# Patient Record
Sex: Male | Born: 1980 | Race: Black or African American | Hispanic: No | Marital: Single
Health system: Midwestern US, Community
[De-identification: ages and names within clinical notes are randomized; demographics above are authoritative.]

## PROBLEM LIST (undated history)

## (undated) HISTORY — PX: FRACTURE SURGERY: SHX138

## (undated) HISTORY — PX: HERNIA REPAIR: SHX51

---
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
## 1999-04-07 ENCOUNTER — Encounter: Payer: Self-pay | Admitting: Emergency Medicine

## 1999-04-07 ENCOUNTER — Emergency Department (HOSPITAL_COMMUNITY): Admission: EM | Admit: 1999-04-07 | Discharge: 1999-04-07 | Payer: Self-pay | Admitting: Emergency Medicine

## 2000-03-07 ENCOUNTER — Encounter: Payer: Self-pay | Admitting: Emergency Medicine

## 2000-03-07 ENCOUNTER — Emergency Department (HOSPITAL_COMMUNITY): Admission: EM | Admit: 2000-03-07 | Discharge: 2000-03-07 | Payer: Self-pay | Admitting: Emergency Medicine

## 2000-03-20 ENCOUNTER — Emergency Department (HOSPITAL_COMMUNITY): Admission: EM | Admit: 2000-03-20 | Discharge: 2000-03-20 | Payer: Self-pay | Admitting: Emergency Medicine

## 2000-06-11 ENCOUNTER — Emergency Department (HOSPITAL_COMMUNITY): Admission: EM | Admit: 2000-06-11 | Discharge: 2000-06-11 | Payer: Self-pay | Admitting: Emergency Medicine

## 2001-08-28 ENCOUNTER — Emergency Department (HOSPITAL_COMMUNITY): Admission: EM | Admit: 2001-08-28 | Discharge: 2001-08-28 | Payer: Self-pay | Admitting: Emergency Medicine

## 2001-08-28 ENCOUNTER — Encounter: Payer: Self-pay | Admitting: Emergency Medicine

## 2004-02-19 ENCOUNTER — Emergency Department (HOSPITAL_COMMUNITY): Admission: EM | Admit: 2004-02-19 | Discharge: 2004-02-19 | Payer: Self-pay | Admitting: Emergency Medicine

## 2007-04-12 ENCOUNTER — Emergency Department (HOSPITAL_COMMUNITY): Admission: EM | Admit: 2007-04-12 | Discharge: 2007-04-12 | Payer: Self-pay | Admitting: Emergency Medicine

## 2008-06-16 ENCOUNTER — Emergency Department (HOSPITAL_COMMUNITY): Admission: EM | Admit: 2008-06-16 | Discharge: 2008-06-16 | Payer: Self-pay | Admitting: Emergency Medicine

## 2010-02-04 ENCOUNTER — Emergency Department (HOSPITAL_COMMUNITY)
Admission: EM | Admit: 2010-02-04 | Discharge: 2010-02-04 | Payer: Self-pay | Source: Home / Self Care | Admitting: Emergency Medicine

## 2010-06-18 NOTE — Consult Note (Signed)
[facility]  Patient:    Bryant, Isaac                    MRN: [id number] Proc. Date: 07/12/00 Adm. Date:  [id number] Disc. Date: [id number] Attending:  Sandi Raveling CC:         Mayra Reel. Chase Picket, M.D.   Consultation Report  REFERRING PHYSICIAN:  Sanford H. Chase Picket, M.D.  REASON FOR CONSULTATION:  This is a very pleasant 30 year old male, left-hand dominant, who was playing basketball and sustained an MP dislocation of the thumb on his left hand.  He was seen by the ER physicians, and they were unable to relocate his thumb MP joint.  He is an otherwise healthy 30 year old male with no known drug allergies and no current medications, recent hospitalizations or surgeries.  FAMILY MEDICAL HISTORY:  Noncontributory.  SOCIAL HISTORY:  Noncontributory.  PHYSICAL EXAMINATION:  EXTREMITIES:  Dislocated MP joint with dorsal displacement of the proximal phalanx of the metacarpal head.  TREATMENT:  The patient was anesthetized using 2% plain lidocaine.  Once this was done, the wrist was fully flexed and, with slight MP pressure, the MP joint was reduced.  Post reduction films show adequate reduction in both the AP and lateral plane.  He was placed in a thumb spica splint and will follow up in my office in the next 24-48 hours. DD:  07/12/00 TD:  07/13/00 Job: [id number]

## 2010-10-25 LAB — RAPID STREP SCREEN (MED CTR MEBANE ONLY): Streptococcus, Group A Screen (Direct): NEGATIVE

## 2010-11-01 ENCOUNTER — Emergency Department (HOSPITAL_COMMUNITY)
Admission: EM | Admit: 2010-11-01 | Discharge: 2010-11-01 | Disposition: A | Discharge status: Home / Self Care | Payer: Medicaid Other | Attending: Emergency Medicine | Admitting: Emergency Medicine

## 2010-11-01 DIAGNOSIS — K089 Disorder of teeth and supporting structures, unspecified: Secondary | ICD-10-CM | POA: Insufficient documentation

## 2010-11-01 DIAGNOSIS — K047 Periapical abscess without sinus: Secondary | ICD-10-CM | POA: Insufficient documentation

## 2010-11-07 ENCOUNTER — Emergency Department (HOSPITAL_COMMUNITY)
Admission: EM | Admit: 2010-11-07 | Discharge: 2010-11-07 | Disposition: A | Discharge status: Home / Self Care | Payer: Medicaid Other | Attending: Emergency Medicine | Admitting: Emergency Medicine

## 2010-11-07 DIAGNOSIS — K047 Periapical abscess without sinus: Secondary | ICD-10-CM | POA: Insufficient documentation

## 2010-11-07 DIAGNOSIS — K089 Disorder of teeth and supporting structures, unspecified: Secondary | ICD-10-CM | POA: Insufficient documentation

## 2011-06-12 ENCOUNTER — Encounter (HOSPITAL_COMMUNITY): Payer: Self-pay | Admitting: *Deleted

## 2011-06-12 ENCOUNTER — Emergency Department (HOSPITAL_COMMUNITY)
Admission: EM | Admit: 2011-06-12 | Discharge: 2011-06-12 | Disposition: A | Discharge status: Home / Self Care | Payer: Medicaid Other | Attending: Emergency Medicine | Admitting: Emergency Medicine

## 2011-06-12 DIAGNOSIS — X503XXA Overexertion from repetitive movements, initial encounter: Secondary | ICD-10-CM | POA: Insufficient documentation

## 2011-06-12 DIAGNOSIS — Y93H9 Activity, other involving exterior property and land maintenance, building and construction: Secondary | ICD-10-CM | POA: Insufficient documentation

## 2011-06-12 DIAGNOSIS — F17200 Nicotine dependence, unspecified, uncomplicated: Secondary | ICD-10-CM | POA: Insufficient documentation

## 2011-06-12 DIAGNOSIS — S335XXA Sprain of ligaments of lumbar spine, initial encounter: Secondary | ICD-10-CM | POA: Insufficient documentation

## 2011-06-12 MED ORDER — DIAZEPAM 5 MG PO TABS: 5.0000 mg | ORAL_TABLET | Freq: Three times a day (TID) | ORAL | Status: AC | PRN

## 2011-06-12 MED ORDER — IBUPROFEN 800 MG PO TABS: 800.0000 mg | ORAL_TABLET | Freq: Three times a day (TID) | ORAL | Status: DC | PRN

## 2011-06-12 NOTE — Discharge Instructions (Signed)
Read the information below.  Rest your back for a short period before working back up to your normal activity.  Use cold compresses, massage, and gentle stretching while you heal.  If you continue to have pain next week, follow up with your primary care provider.  You may return to the ER at any time for worsening condition or any new symptoms that concern you.   Lumbosacral Strain Lumbosacral strain is one of the most common causes of back pain. There are many causes of back pain. Most are not serious conditions. CAUSES  Your backbone (spinal column) is made up of 24 main vertebral bodies, the sacrum, and the coccyx. These are held together by muscles and tough, fibrous tissue (ligaments). Nerve roots pass through the openings between the vertebrae. A sudden move or injury to the back may cause injury to, or pressure on, these nerves. This may result in localized back pain or pain movement (radiation) into the buttocks, down the leg, and into the foot. Sharp, shooting pain from the buttock down the back of the leg (sciatica) is frequently associated with a ruptured (herniated) disk. Pain may be caused by muscle spasm alone. Your caregiver can often find the cause of your pain by the details of your symptoms and an exam. In some cases, you may need tests (such as X-rays). Your caregiver will work with you to decide if any tests are needed based on your specific exam. HOME CARE INSTRUCTIONS   Avoid an underactive lifestyle. Active exercise, as directed by your caregiver, is your greatest weapon against back pain.   Avoid hard physical activities (tennis, racquetball, waterskiing) if you are not in proper physical condition for it. This may aggravate or create problems.   If you have a back problem, avoid sports requiring sudden body movements. Swimming and walking are generally safer activities.   Maintain good posture.   Avoid becoming overweight (obese).   Use bed rest for only the most extreme,  sudden (acute) episode. Your caregiver will help you determine how much bed rest is necessary.   For acute conditions, you may put ice on the injured area.   Put ice in a plastic bag.   Place a towel between your skin and the bag.   Leave the ice on for 15 to 20 minutes at a time, every 2 hours, or as needed.   After you are improved and more active, it may help to apply heat for 30 minutes before activities.  See your caregiver if you are having pain that lasts longer than expected. Your caregiver can advise appropriate exercises or therapy if needed. With conditioning, most back problems can be avoided. SEEK IMMEDIATE MEDICAL CARE IF:   You have numbness, tingling, weakness, or problems with the use of your arms or legs.   You experience severe back pain not relieved with medicines.   There is a change in bowel or bladder control.   You have increasing pain in any area of the body, including your belly (abdomen).   You notice shortness of breath, dizziness, or feel faint.   You feel sick to your stomach (nauseous), are throwing up (vomiting), or become sweaty.   You notice discoloration of your toes or legs, or your feet get very cold.   Your back pain is getting worse.   You have a fever.  MAKE SURE YOU:   Understand these instructions.   Will watch your condition.   Will get help right away if you are  not doing well or get worse.  Document Released: 10/27/2004 Document Revised: 01/06/2011 Document Reviewed: 04/18/2008 [facility] Patient Information 2012 [place], [place].  If you have no primary doctor, here are some resources that may be helpful:  Medicaid-accepting [facility] Providers:   - [facility]- [address]      [phone]      Mon-Fri 9am-7pm, Sat 9am-1pm   - [facility]- [address] [place]      [phone]   - [facility]- [address] [place]      [phone]   [facility]- [address]      [phone]   - Renaye Rakers- [address] [place], [address]      [phone]      Only accepts [place] patients       after they have her name applied to their card   Self Pay (no insurance) in [place]:   - Sickle Cell Patients: Dr Willey Blade, [facility]      [address] [place]      [phone]   - [phone]   - Physician Referral Service- [phone]   - [facility]- [address] [place]      [phone]   [facility] [place]- [address]   - [facility]- see information above      (Speak to [employer] if you do not have insurance)   - [facility]- [address] [place]      [phone]   - [facility] [place]- [address]      [phone]   - [facility]- [address]      [phone]   - Dr Julio Sicks-  [address], [place]      [phone]   - [facility]- [address]      [phone]   - [facility]- [address]      [phone]      Also [address]      [phone]   - [facility]- [address]      [phone]      1st and 3rd Saturday every month, 10am-1pm Other agencies that provide inexpensive medical care:    [facility]  [phone]    [facility]  [phone]    [facility]  [phone]    [facility]  [phone]    [facility]  [phone]  General Information: Finding a doctor when you do not have health insurance can be tricky. Although you are not limited by an insurance plan, you are of course limited by her finances and how much but he can pay out of pocket.  What are your options if you don't have health insurance?   1) Find a Librarian, academic and Pay Out of Pocket Although you won't have to find out who is covered by your insurance plan, it is a good idea to ask around and get  recommendations. You will then need to call the office and see if the doctor you have chosen will accept you as a new patient and what types of options they offer for patients who are self-pay. Some doctors offer discounts or will set up payment plans  for their patients who do not have insurance, but you will need to ask so you aren't surprised when you get to your appointment.  2) Contact Your Local Health Department Not all health departments have doctors that can see patients for sick visits, but many do, so it is worth a call to see if yours does. If you don't know where your local health department is, you can check in your phone book. The CDC also has a tool to help you locate your state's health department, and many state websites also have listings of all of their local health departments.  3) Find a Walk-in Clinic If your illness is not likely to be very severe or complicated, you may want to try a walk in clinic. These are popping up all over the country in pharmacies, drugstores, and shopping centers. They're usually staffed by nurse practitioners or physician assistants that have been trained to treat common illnesses and complaints. They're usually fairly quick and inexpensive. However, if you have serious medical issues or chronic medical problems, these are probably not your best option   RESOURCE GUIDE  Dental Problems  Patients with Medicaid: [facility] 405 100 [address]                                           [phone] [address] [employer] Phone:  [phone]                                                  Phone:  [phone]  If unable to pay or uninsured, contact:  [facility] or [facility]. to become qualified for the adult dental clinic.  Chronic Pain Problems Contact [facility]  [phone] Patients need to be referred by their primary care doctor.  Insufficient Money for Medicine Contact  [employer] Way:  call "211" or [facility] [phone].  No Primary Care Doctor Call [facility]  [phone] Other agencies that provide inexpensive medical care    [facility]  [phone]    [facility]  [phone]    [facility]  [phone]    [facility]  [phone]    [facility]  [phone]    [facility]  [phone]  Psychological Services [facility]  [phone] [facility]  [phone] [facility]   [phone] (emergency services [phone])  Substance Abuse Resources [facility] Drug Services  [phone] [facility] [phone] [facility] [phone] Floydene Flock [phone] Residential & Outpatient Substance Abuse Program  [phone]  Abuse/Neglect [facility] Child Abuse Hotline [phone] [facility] Child Abuse Hotline 218-083-1520 (After Hours)  Emergency Shelter [facility] [phone]  [facility] [place] [facility] [place] [phone] [facility] [phone]  MRSA Hotline #:   803-256-2977    [facility] [place]  [facility] Dept. [address]      [place] Phone:  [phone]                                   Phone:  [phone]                 Phone:  [place] Phone:  [place] [phone] (After Hours)

## 2011-06-12 NOTE — ED Notes (Signed)
Pt from home with reports of lower back pain, worse on the right with radiation to right hip area since Thursday. Pt reports hx of back injury with pain at age 31 but reports painting a high ceiling on Thursday with increase in pain since. Pt denies fall.

## 2011-06-12 NOTE — ED Provider Notes (Signed)
History     CSN: [id number]  Arrival date & time 06/12/11  1209   First MD Initiated Contact with Patient 06/12/11 1242      Chief Complaint  Patient presents with  . Back Pain    Lower, worse on right    (Consider location/radiation/quality/duration/timing/severity/associated sxs/prior treatment) HPI Comments: Patient reports pain in his back that began 3-4 days ago after reaching up to paint a high ceiling.  States he was initially sore but over several days it has gotten worse.  Pain is constant and aching, but when he walks, twists or bends it is sharp.  Pain radiates around back, worse on right around iliac crest. Does not radiate into buttocks or legs.  Using tylenol without relief.  Reported injury to back as a teenager occurred when patient lifted a heavy log and had pain in his back for a week that then subsided.  Denies fevers, bowel or bladder incontinence or retention, weakness or numbness of the extremities. Denies abdominal pain, urinary or bowel symptoms.    Patient is a 31 y.o. male presenting with back pain. The history is provided by the patient.  Back Pain  Pertinent negatives include no fever, no numbness, no abdominal pain and no weakness.    History reviewed. No pertinent past medical history.  Past Surgical History  Procedure Date  . Hernia repair     History reviewed. No pertinent family history.  History  Substance Use Topics  . Smoking status: Current Everyday Smoker -- 0.5 packs/day    Types: Cigarettes  . Smokeless tobacco: Never Used  . Alcohol Use: Yes     weekends      Review of Systems  Constitutional: Negative for fever and chills.  Gastrointestinal: Negative for vomiting, abdominal pain and diarrhea.  Musculoskeletal: Positive for back pain.  Neurological: Negative for weakness and numbness.    Allergies  Review of patient's allergies indicates no known allergies.  Home Medications   Current Outpatient Rx  Name Route Sig  Dispense Refill  . ACETAMINOPHEN 500 MG PO TABS Oral Take 1,500 mg by mouth every 6 (six) hours as needed. For pain    . TROLAMINE SALICYLATE 10 % EX CREA Topical Apply 1 application topically as needed. For pain      BP 131/68  Pulse 64  Temp(Src) 98.3 F (36.8 C) (Oral)  Resp 16  Wt 190 lb (86.183 kg)  SpO2 98%  Physical Exam  Nursing note and vitals reviewed. Constitutional: He is oriented to person, place, and time. He appears well-developed and well-nourished.  HENT:  Head: Normocephalic and atraumatic.  Neck: Normal range of motion. Neck supple.  Cardiovascular: Normal rate and regular rhythm.   Pulmonary/Chest: Effort normal and breath sounds normal.  Abdominal: Soft. He exhibits no distension and no mass. There is no tenderness. There is no rebound and no guarding.  Musculoskeletal: Normal range of motion. He exhibits no tenderness.       Cervical back: Normal.       Thoracic back: Normal.       Lumbar back: Normal.       No palpable tenderness of back.  Right sided muscular appears slightly swollen compared to left.  Straight leg raise negative.  Lower extremities: strength 5/5, sensation intact, distal pulses intact.   Neurological: He is alert and oriented to person, place, and time. He has normal strength. No sensory deficit.    ED Course  Procedures (including critical care time)  Labs Reviewed - No  data to display No results found.   1. Low back strain       MDM  Patient with low back strain from painting.  No red flags for back pain.  Pt d/c home with care instructions, return precautions, PCP follow up.  Patient verbalizes understanding and agrees with plan.          Rise Patience, [place] 06/12/11 1314

## 2011-06-17 ENCOUNTER — Encounter (HOSPITAL_COMMUNITY): Payer: Self-pay | Admitting: Emergency Medicine

## 2011-06-17 ENCOUNTER — Encounter (HOSPITAL_COMMUNITY): Payer: Self-pay | Admitting: *Deleted

## 2011-06-17 ENCOUNTER — Emergency Department (HOSPITAL_COMMUNITY): Payer: Medicaid Other

## 2011-06-17 ENCOUNTER — Emergency Department (HOSPITAL_COMMUNITY): Payer: Medicaid Other | Admitting: *Deleted

## 2011-06-17 ENCOUNTER — Inpatient Hospital Stay (HOSPITAL_COMMUNITY)
Admission: EM | Admit: 2011-06-17 | Discharge: 2011-06-25 | DRG: 494 | Disposition: A | Discharge status: Home / Self Care | Payer: Medicaid Other | Attending: Orthopedic Surgery | Admitting: Orthopedic Surgery

## 2011-06-17 ENCOUNTER — Encounter (HOSPITAL_COMMUNITY)
Admission: EM | Disposition: A | Discharge status: Home / Self Care | Payer: Self-pay | Source: Home / Self Care | Attending: Orthopedic Surgery

## 2011-06-17 DIAGNOSIS — S82202B Unspecified fracture of shaft of left tibia, initial encounter for open fracture type I or II: Secondary | ICD-10-CM

## 2011-06-17 DIAGNOSIS — F17200 Nicotine dependence, unspecified, uncomplicated: Secondary | ICD-10-CM | POA: Diagnosis present

## 2011-06-17 DIAGNOSIS — S82209B Unspecified fracture of shaft of unspecified tibia, initial encounter for open fracture type I or II: Principal | ICD-10-CM | POA: Diagnosis present

## 2011-06-17 DIAGNOSIS — W1789XA Other fall from one level to another, initial encounter: Secondary | ICD-10-CM | POA: Diagnosis present

## 2011-06-17 LAB — BASIC METABOLIC PANEL
BUN: 14 mg/dL (ref 6–23)
Creatinine, Ser: 1.18 mg/dL (ref 0.50–1.35)
GFR calc Af Amer: >90 mL/min (ref >90)
GFR calc non Af Amer: 80 mL/min — ABNORMAL LOW (ref >90)
Potassium: 3.4 mEq/L — ABNORMAL LOW (ref 3.5–5.1)

## 2011-06-17 LAB — CBC
HCT: 42.1 % (ref 39.0–52.0)
MCHC: 35.9 g/dL (ref 30.0–36.0)
MCV: 88.4 fL (ref 78.0–100.0)
RDW: 12.3 % (ref 11.5–15.5)

## 2011-06-17 SURGERY — OPEN REDUCTION INTERNAL FIXATION (ORIF) TIBIA/FIBULA FRACTURE
Anesthesia: General | Site: Leg Lower | Laterality: Left | Wound class: Contaminated

## 2011-06-17 MED ORDER — HYDROMORPHONE HCL PF 1 MG/ML IJ SOLN
1.0000 mg | Freq: Once | INTRAMUSCULAR | Status: AC
  Administered 2011-06-17: 1 mg via INTRAVENOUS
  Filled 2011-06-17: qty 1

## 2011-06-17 MED ORDER — WHITE PETROLATUM GEL
Status: AC
  Filled 2011-06-17: qty 5

## 2011-06-17 MED ORDER — SODIUM CHLORIDE 0.9 % IV BOLUS (SEPSIS)
1000.0000 mL | Freq: Once | INTRAVENOUS | Status: AC
  Administered 2011-06-17: 1000 mL via INTRAVENOUS

## 2011-06-17 MED ORDER — TETANUS-DIPHTH-ACELL PERTUSSIS 5-2.5-18.5 LF-MCG/0.5 IM SUSP
0.5000 mL | Freq: Once | INTRAMUSCULAR | Status: AC
  Administered 2011-06-17: 0.5 mL via INTRAMUSCULAR
  Filled 2011-06-17: qty 0.5

## 2011-06-17 MED ORDER — SODIUM CHLORIDE 0.9 % IV SOLN
3.0000 g | Freq: Once | INTRAVENOUS | Status: AC
  Administered 2011-06-17: 3 g via INTRAVENOUS
  Filled 2011-06-17: qty 3

## 2011-06-17 MED ORDER — ACETAMINOPHEN 10 MG/ML IV SOLN
INTRAVENOUS | Status: AC
  Filled 2011-06-17: qty 100

## 2011-06-17 MED ORDER — LACTATED RINGERS IV SOLN
INTRAVENOUS | Status: DC | PRN
  Administered 2011-06-17 – 2011-06-18 (×2): via INTRAVENOUS

## 2011-06-17 MED ORDER — ONDANSETRON HCL 4 MG/2ML IJ SOLN
4.0000 mg | Freq: Once | INTRAMUSCULAR | Status: AC
  Administered 2011-06-17: 4 mg via INTRAVENOUS
  Filled 2011-06-17: qty 2

## 2011-06-17 SURGICAL SUPPLY — 31 items
BAG ZIPLOCK 12X15 (MISCELLANEOUS) ×2 IMPLANT
BANDAGE GAUZE ELAST BULKY 4 IN (GAUZE/BANDAGES/DRESSINGS) ×6 IMPLANT
BIT DRILL 3.2X240 A/O LONG (BIT) ×2 IMPLANT
CLAMP 2 3/5OST (Clamp) ×4 IMPLANT
CLAMP 5 HOLE (Clamp) ×2 IMPLANT
CLAMP PIN-ROD (Clamp) ×4 IMPLANT
CLAMP ROD-ROD (Clamp) ×4 IMPLANT
CLOTH BEACON ORANGE TIMEOUT ST (SAFETY) ×2 IMPLANT
DRAPE C-ARM 42X72 X-RAY (DRAPES) ×2 IMPLANT
DRAPE U-SHAPE 47X51 STRL (DRAPES) ×2 IMPLANT
DRSG PAD ABDOMINAL 8X10 ST (GAUZE/BANDAGES/DRESSINGS) ×2 IMPLANT
ELECT REM PT RETURN 9FT ADLT (ELECTROSURGICAL) ×2
ELECTRODE REM PT RTRN 9FT ADLT (ELECTROSURGICAL) ×1 IMPLANT
GAUZE XEROFORM 1X8 LF (GAUZE/BANDAGES/DRESSINGS) ×4 IMPLANT
GLOVE ECLIPSE 8.5 STRL (GLOVE) ×2 IMPLANT
GLOVE SURG ORTHO 8.5 STRL (GLOVE) ×6 IMPLANT
GOWN SRG XL XLNG 56XLVL 4 (GOWN DISPOSABLE) ×2 IMPLANT
GOWN STRL NON-REIN XL XLG LVL4 (GOWN DISPOSABLE) ×2
GOWN STRL REIN 2XL XLG LVL4 (GOWN DISPOSABLE) ×4 IMPLANT
MANIFOLD NEPTUNE II (INSTRUMENTS) ×2 IMPLANT
NS IRRIG 1000ML POUR BTL (IV SOLUTION) ×2 IMPLANT
PACK LOWER EXTREMITY WL (CUSTOM PROCEDURE TRAY) ×2 IMPLANT
PAD CAST 4YDX4 CTTN HI CHSV (CAST SUPPLIES) ×1 IMPLANT
PADDING CAST COTTON 4X4 STRL (CAST SUPPLIES) ×1
POSITIONER SURGICAL ARM (MISCELLANEOUS) ×2 IMPLANT
ROD CARBON FIBER 300X9.5MM (Rod) ×4 IMPLANT
SCREW BONE SELF DRILL/TAP5X150 (Screw) ×4 IMPLANT
SCREW TRANSFIXING 6X350MM (Screw) ×2 IMPLANT
SPONGE GAUZE 4X4 12PLY (GAUZE/BANDAGES/DRESSINGS) ×4 IMPLANT
SPONGE LAP 4X18 X RAY DECT (DISPOSABLE) ×2 IMPLANT
TOWEL OR 17X26 10 PK STRL BLUE (TOWEL DISPOSABLE) ×2 IMPLANT

## 2011-06-17 NOTE — ED Notes (Signed)
Pt returns to room, cont to monitor

## 2011-06-17 NOTE — ED Provider Notes (Signed)
History     CSN: [id number]  Arrival date & time 06/17/11  2137   First MD Initiated Contact with Patient 06/17/11 2141      Chief Complaint  Patient presents with  . Leg Injury    (Consider location/radiation/quality/duration/timing/severity/associated sxs/prior treatment) The history is provided by the patient.  pt jumped over fence tonight. Open fracture to left ankle. Constant, severe pain, non radiating, worse w movement or palpation. No numbness. Denies other injury. No knee or hip pain. No neck or back pain. No head injury, loc or headache. No nv. ems gave fentanyl iv w only mild improvement in pain. Tetanus unknown.   History reviewed. No pertinent past medical history.  Past Surgical History  Procedure Date  . Hernia repair     No family history on file.  History  Substance Use Topics  . Smoking status: Current Everyday Smoker -- 0.5 packs/day    Types: Cigarettes  . Smokeless tobacco: Never Used  . Alcohol Use: Yes     weekends      Review of Systems  Constitutional: Negative for fever.  HENT: Negative for neck pain.   Eyes: Negative for redness.  Respiratory: Negative for shortness of breath.   Cardiovascular: Negative for chest pain.  Gastrointestinal: Negative for abdominal pain.  Genitourinary: Negative for flank pain.  Musculoskeletal: Negative for back pain.  Skin: Negative for rash.  Neurological: Negative for headaches.  Hematological: Does not bruise/bleed easily.  Psychiatric/Behavioral: Negative for confusion.    Allergies  Review of patient's allergies indicates no known allergies.  Home Medications   Current Outpatient Rx  Name Route Sig Dispense Refill  . ACETAMINOPHEN 500 MG PO TABS Oral Take 1,500 mg by mouth every 6 (six) hours as needed. For pain    . DIAZEPAM 5 MG PO TABS Oral Take 1 tablet (5 mg total) by mouth every 8 (eight) hours as needed (muscle spasm). 14 tablet 0  . IBUPROFEN 800 MG PO TABS Oral Take 1 tablet (800 mg  total) by mouth every 8 (eight) hours as needed for pain. 21 tablet 0  . TROLAMINE SALICYLATE 10 % EX CREA Topical Apply 1 application topically as needed. For pain      BP 143/67  Pulse 130  Resp 16  Wt 190 lb (86.183 kg)  SpO2 93%  Physical Exam  Nursing note and vitals reviewed. Constitutional: He is oriented to person, place, and time. He appears well-developed and well-nourished. No distress.  HENT:  Head: Atraumatic.  Eyes: Pupils are equal, round, and reactive to light.  Neck: Neck supple. No tracheal deviation present.  Cardiovascular: Normal rate, regular rhythm, normal heart sounds and intact distal pulses.   Pulmonary/Chest: Effort normal and breath sounds normal. No accessory muscle usage. No respiratory distress. He exhibits no tenderness.  Abdominal: Soft. He exhibits no distension. There is no tenderness.  Musculoskeletal:       Left ankle splinted by ems. Open wound/deformity to medial aspect ankle w slow steady bleeding dark blood. Distal pulses palp. No other focal bony tenderness noted on bil extremity exam.  ctls spine non tender, aligned, no step off.   Neurological: He is alert and oriented to person, place, and time.       Motor intact bil.   Skin: Skin is warm and dry.  Psychiatric: He has a normal mood and affect.    ED Course  Procedures (including critical care time)  Results for orders placed during the hospital encounter of 06/17/11  CBC  Component Value Range   WBC 11.6 (*) 4.0 - 10.5 (K/uL)   RBC 4.76  4.22 - 5.81 (MIL/uL)   Hemoglobin 15.1  13.0 - 17.0 (g/dL)   HCT 45.4  09.8 - 11.9 (%)   MCV 88.4  78.0 - 100.0 (fL)   MCH 31.7  26.0 - 34.0 (pg)   MCHC 35.9  30.0 - 36.0 (g/dL)   RDW 14.7  82.9 - 56.2 (%)   Platelets 210  150 - 400 (K/uL)   Dg Tibia/fibula Left  06/17/2011  *RADIOLOGY REPORT*  Clinical Data: Distal left leg pain status post trauma.  LEFT TIBIA AND FIBULA - 2 VIEW  Comparison: Contemporaneous ankle  Findings: See  contemporaneous ankle radiograph report regarding the ankle.  No proximal fractures identified.  The knee joint appears intact without effusion.  IMPRESSION: Complex ankle fracture as described on contemporaneous ankle radiograph.  No proximal fracture identified.  Original Report Authenticated By: Waneta Martins, M.D.   Dg Ankle Complete Left  06/17/2011  *RADIOLOGY REPORT*  Clinical Data: Left ankle pain  LEFT ANKLE COMPLETE - 3+ VIEW  Comparison: None.  Findings: Comminuted fractures of the distal tibia and fibula with lateral displacement of the distal components.  The proximal tibia extends to the skin surface, and is likely open. The tibial fracture line extends to involve the posterior malleolus and into the tibiotalar joint. There is mild lateral joint space widening and ankle mortise disruption is not excluded.  A talar fracture is not definitively identified.  No calcaneal fracture identified.  IMPRESSION: Complex fracture of the distal tibia and fibula as described above.  Original Report Authenticated By: Waneta Martins, M.D.     MDM  Iv ns. Confirmed nkda w pt. Dilaudid iv. Bolus ns. Xrays.   Leg/ankle kept splinted, elevated, cold pack. Sterile dressing.   unasyn iv. tetanys im.   Ortho called.  Dr Ranell Patrick informed of open fx and that xrays pending - he will come in to see.  xrays back, discussed w pt, recheck distal pulses palp. Pain improved but persists. Dilaudid 1 mg iv.        Suzi Roots, MD 06/17/11 [phone]

## 2011-06-17 NOTE — Anesthesia Preprocedure Evaluation (Addendum)
Anesthesia Evaluation  Patient identified by MRN, date of birth, ID band Patient awake    Reviewed: Allergy & Precautions, H&P , NPO status , Patient's Chart, lab work & pertinent test results, reviewed documented beta blocker date and time   Airway Mallampati: I TM Distance: >3 FB Neck ROM: full    Dental No notable dental hx.    Pulmonary neg pulmonary ROS,  breath sounds clear to auscultation  Pulmonary exam normal       Cardiovascular Exercise Tolerance: Good negative cardio ROS  Rhythm:regular Rate:Normal     Neuro/Psych negative neurological ROS  negative psych ROS   GI/Hepatic negative GI ROS, Neg liver ROS,   Endo/Other  negative endocrine ROS  Renal/GU negative Renal ROS  negative genitourinary   Musculoskeletal   Abdominal   Peds  Hematology negative hematology ROS (+)   Anesthesia Other Findings Upper front R loose tooth  Reproductive/Obstetrics negative OB ROS                           Anesthesia Physical Anesthesia Plan  ASA: II and Emergent  Anesthesia Plan: General, Rapid Sequence and Cricoid Pressure   Post-op Pain Management:    Induction:   Airway Management Planned: Oral ETT  Additional Equipment:   Intra-op Plan:   Post-operative Plan:   Informed Consent: I have reviewed the patients History and Physical, chart, labs and discussed the procedure including the risks, benefits and alternatives for the proposed anesthesia with the patient or authorized representative who has indicated his/her understanding and acceptance.   Dental Advisory Given  Plan Discussed with: CRNA  Anesthesia Plan Comments:        Anesthesia Quick Evaluation

## 2011-06-17 NOTE — ED Notes (Signed)
Ortho Sx bedside

## 2011-06-17 NOTE — ED Notes (Signed)
Patient transported to X-ray 

## 2011-06-17 NOTE — ED Notes (Signed)
Pt alert, arrives via EMS, c/o fx to left lower ext, onset this evening, per EMS, wound open with bones ext, pms intact, + ETOH, resp even unlabored, skin pwd, IV est, 200 mcg fentanyl given by EMS

## 2011-06-17 NOTE — H&P (Signed)
Isaac Bryant is an 31 y.o. male.   Chief Complaint: Left ankle open fracture HPI: 31 yo male s/p jumped from a fence injuring the left ankle.  Patient with an obvious open fracture on the left.  Unable to ambulate.  EMS transported the patient to [facility] ED.  History reviewed. No pertinent past medical history.  Past Surgical History  Procedure Date  . Hernia repair     No family history on file. Social History:  reports that he has been smoking Cigarettes.  He has been smoking about .5 packs per day. He has never used smokeless tobacco. He reports that he drinks alcohol. He reports that he does not use illicit drugs.  Allergies: No Known Allergies   (Not in a hospital admission)  No results found for this or any previous visit (from the past 48 hour(s)). Dg Tibia/fibula Left  06/17/2011  *RADIOLOGY REPORT*  Clinical Data: Distal left leg pain status post trauma.  LEFT TIBIA AND FIBULA - 2 VIEW  Comparison: Contemporaneous ankle  Findings: See contemporaneous ankle radiograph report regarding the ankle.  No proximal fractures identified.  The knee joint appears intact without effusion.  IMPRESSION: Complex ankle fracture as described on contemporaneous ankle radiograph.  No proximal fracture identified.  Original Report Authenticated By: Waneta Martins, M.D.   Dg Ankle Complete Left  06/17/2011  *RADIOLOGY REPORT*  Clinical Data: Left ankle pain  LEFT ANKLE COMPLETE - 3+ VIEW  Comparison: None.  Findings: Comminuted fractures of the distal tibia and fibula with lateral displacement of the distal components.  The proximal tibia extends to the skin surface, and is likely open. The tibial fracture line extends to involve the posterior malleolus and into the tibiotalar joint. There is mild lateral joint space widening and ankle mortise disruption is not excluded.  A talar fracture is not definitively identified.  No calcaneal fracture identified.  IMPRESSION: Complex fracture of the distal  tibia and fibula as described above.  Original Report Authenticated By: Waneta Martins, M.D.    ROS  Blood pressure 156/72, pulse 127, resp. rate 14, weight 86.183 kg (190 lb), SpO2 96.00%. Physical Exam   Left ankle with splint in place.  3-5 cm open laceration medially contiguous with the fracture. Toes perfused.  Sensation intact  Assessment/Plan Grade II left open pilon fracture I+D Left open ankle fracture planned with temporary X FIX. WIll consult Dr Victorino Dike for definitive fixation. CT scan planned for after surgery.  Kienna Moncada,STEVEN R 06/17/2011, 10:44 PM

## 2011-06-17 NOTE — ED Notes (Signed)
WUJ:[id number]<JY> Expected date:<BR> Expected time: 9:29 PM<BR> Means of arrival:Ambulance<BR> Comments:<BR> M241 -- Ankle-tib/fib Fracture

## 2011-06-18 ENCOUNTER — Emergency Department (HOSPITAL_COMMUNITY): Payer: Medicaid Other

## 2011-06-18 ENCOUNTER — Inpatient Hospital Stay (HOSPITAL_COMMUNITY): Payer: Medicaid Other

## 2011-06-18 MED ORDER — HYDROMORPHONE HCL PF 1 MG/ML IJ SOLN
0.5000 mg | INTRAMUSCULAR | Status: DC | PRN
  Administered 2011-06-18 (×4): 2 mg via INTRAVENOUS
  Administered 2011-06-18: 1 mg via INTRAVENOUS
  Administered 2011-06-18 – 2011-06-20 (×11): 2 mg via INTRAVENOUS
  Administered 2011-06-20: 1 mg via INTRAVENOUS
  Administered 2011-06-20 (×4): 2 mg via INTRAVENOUS
  Filled 2011-06-18 (×13): qty 2
  Filled 2011-06-18: qty 1
  Filled 2011-06-18 (×5): qty 2
  Filled 2011-06-18: qty 1
  Filled 2011-06-18: qty 2

## 2011-06-18 MED ORDER — DEXAMETHASONE SODIUM PHOSPHATE 10 MG/ML IJ SOLN
INTRAMUSCULAR | Status: DC | PRN
  Administered 2011-06-18: 10 mg via INTRAVENOUS

## 2011-06-18 MED ORDER — HYDROMORPHONE HCL PF 1 MG/ML IJ SOLN
INTRAMUSCULAR | Status: AC
  Filled 2011-06-18: qty 1

## 2011-06-18 MED ORDER — OXYCODONE-ACETAMINOPHEN 5-325 MG PO TABS
1.0000 | ORAL_TABLET | ORAL | Status: DC | PRN
  Administered 2011-06-18: 2 via ORAL
  Filled 2011-06-18: qty 2

## 2011-06-18 MED ORDER — SUCCINYLCHOLINE CHLORIDE 20 MG/ML IJ SOLN
INTRAMUSCULAR | Status: DC | PRN
  Administered 2011-06-17: 120 mg via INTRAVENOUS

## 2011-06-18 MED ORDER — HYDROMORPHONE HCL PF 1 MG/ML IJ SOLN
0.2500 mg | INTRAMUSCULAR | Status: DC | PRN
  Administered 2011-06-18 (×2): 0.5 mg via INTRAVENOUS

## 2011-06-18 MED ORDER — POTASSIUM CHLORIDE IN NACL 20-0.9 MEQ/L-% IV SOLN
INTRAVENOUS | Status: DC
  Administered 2011-06-18: 03:00:00 via INTRAVENOUS
  Administered 2011-06-18: 1000 mL via INTRAVENOUS
  Administered 2011-06-19 – 2011-06-20 (×2): via INTRAVENOUS
  Filled 2011-06-18 (×9): qty 1000

## 2011-06-18 MED ORDER — SODIUM CHLORIDE 0.9 % IV SOLN
3.0000 g | Freq: Four times a day (QID) | INTRAVENOUS | Status: DC
  Administered 2011-06-18 – 2011-06-20 (×10): 3 g via INTRAVENOUS
  Filled 2011-06-18 (×13): qty 3

## 2011-06-18 MED ORDER — METOCLOPRAMIDE HCL 5 MG/ML IJ SOLN: 5.0000 mg | Freq: Three times a day (TID) | INTRAMUSCULAR | Status: DC | PRN

## 2011-06-18 MED ORDER — OXYCODONE HCL 5 MG PO TABS
5.0000 mg | ORAL_TABLET | ORAL | Status: DC | PRN
  Administered 2011-06-18 (×2): 10 mg via ORAL
  Administered 2011-06-18 – 2011-06-19 (×2): 15 mg via ORAL
  Administered 2011-06-19: 10 mg via ORAL
  Administered 2011-06-19 – 2011-06-20 (×4): 15 mg via ORAL
  Administered 2011-06-20: 10 mg via ORAL
  Administered 2011-06-20 – 2011-06-22 (×11): 15 mg via ORAL
  Administered 2011-06-22: 10 mg via ORAL
  Administered 2011-06-23 (×2): 15 mg via ORAL
  Administered 2011-06-23: 10 mg via ORAL
  Administered 2011-06-23: 15 mg via ORAL
  Administered 2011-06-24 (×2): 5 mg via ORAL
  Filled 2011-06-18: qty 3
  Filled 2011-06-18: qty 1
  Filled 2011-06-18: qty 3
  Filled 2011-06-18: qty 2
  Filled 2011-06-18 (×5): qty 3
  Filled 2011-06-18: qty 2
  Filled 2011-06-18: qty 3
  Filled 2011-06-18: qty 2
  Filled 2011-06-18 (×4): qty 3
  Filled 2011-06-18: qty 2
  Filled 2011-06-18: qty 3
  Filled 2011-06-18: qty 2
  Filled 2011-06-18 (×3): qty 3
  Filled 2011-06-18: qty 2
  Filled 2011-06-18 (×2): qty 3
  Filled 2011-06-18: qty 2
  Filled 2011-06-18 (×4): qty 3

## 2011-06-18 MED ORDER — PROPOFOL 10 MG/ML IV EMUL
INTRAVENOUS | Status: DC | PRN
  Administered 2011-06-17: 200 mg via INTRAVENOUS

## 2011-06-18 MED ORDER — 0.9 % SODIUM CHLORIDE (POUR BTL) OPTIME
TOPICAL | Status: DC | PRN
  Administered 2011-06-18: 1000 mL

## 2011-06-18 MED ORDER — LIDOCAINE HCL (CARDIAC) 20 MG/ML IV SOLN
INTRAVENOUS | Status: DC | PRN
  Administered 2011-06-17: 100 mg via INTRAVENOUS

## 2011-06-18 MED ORDER — ENOXAPARIN SODIUM 30 MG/0.3ML ~~LOC~~ SOLN
30.0000 mg | Freq: Two times a day (BID) | SUBCUTANEOUS | Status: DC
  Administered 2011-06-19 – 2011-06-20 (×3): 30 mg via SUBCUTANEOUS
  Filled 2011-06-18 (×6): qty 0.3

## 2011-06-18 MED ORDER — MEPERIDINE HCL 50 MG/ML IJ SOLN: 6.2500 mg | INTRAMUSCULAR | Status: DC | PRN

## 2011-06-18 MED ORDER — FENTANYL CITRATE 0.05 MG/ML IJ SOLN
INTRAMUSCULAR | Status: DC | PRN
  Administered 2011-06-17 (×2): 50 ug via INTRAVENOUS
  Administered 2011-06-17: 100 ug via INTRAVENOUS
  Administered 2011-06-18 (×2): 50 ug via INTRAVENOUS

## 2011-06-18 MED ORDER — METHOCARBAMOL 500 MG PO TABS
500.0000 mg | ORAL_TABLET | Freq: Four times a day (QID) | ORAL | Status: DC | PRN
  Administered 2011-06-18 – 2011-06-20 (×5): 500 mg via ORAL
  Filled 2011-06-18 (×6): qty 1

## 2011-06-18 MED ORDER — CISATRACURIUM BESYLATE 2 MG/ML IV SOLN
INTRAVENOUS | Status: DC | PRN
  Administered 2011-06-18: 4 mg via INTRAVENOUS

## 2011-06-18 MED ORDER — ONDANSETRON HCL 4 MG/2ML IJ SOLN
INTRAMUSCULAR | Status: DC | PRN
  Administered 2011-06-17 (×2): 2 mg via INTRAVENOUS

## 2011-06-18 MED ORDER — ONDANSETRON HCL 4 MG/2ML IJ SOLN: 4.0000 mg | Freq: Four times a day (QID) | INTRAMUSCULAR | Status: DC | PRN

## 2011-06-18 MED ORDER — GLYCOPYRROLATE 0.2 MG/ML IJ SOLN
INTRAMUSCULAR | Status: DC | PRN
  Administered 2011-06-18: 0.2 mg via INTRAVENOUS

## 2011-06-18 MED ORDER — METHOCARBAMOL 100 MG/ML IJ SOLN
500.0000 mg | Freq: Four times a day (QID) | INTRAVENOUS | Status: DC | PRN
  Administered 2011-06-18: 500 mg via INTRAVENOUS
  Filled 2011-06-18 (×2): qty 5

## 2011-06-18 MED ORDER — LACTATED RINGERS IV SOLN: INTRAVENOUS | Status: DC

## 2011-06-18 MED ORDER — SODIUM CHLORIDE 0.9 % IR SOLN
Status: DC | PRN
  Administered 2011-06-18: 3000 mL

## 2011-06-18 MED ORDER — SODIUM CHLORIDE 0.9 % IV SOLN: INTRAVENOUS | Status: DC

## 2011-06-18 MED ORDER — ACETAMINOPHEN 10 MG/ML IV SOLN
INTRAVENOUS | Status: DC | PRN
  Administered 2011-06-17: 1000 mg via INTRAVENOUS

## 2011-06-18 MED ORDER — PROMETHAZINE HCL 25 MG/ML IJ SOLN: 6.2500 mg | INTRAMUSCULAR | Status: DC | PRN

## 2011-06-18 MED ORDER — ONDANSETRON HCL 4 MG PO TABS: 4.0000 mg | ORAL_TABLET | Freq: Four times a day (QID) | ORAL | Status: DC | PRN

## 2011-06-18 MED ORDER — NEOSTIGMINE METHYLSULFATE 1 MG/ML IJ SOLN
INTRAMUSCULAR | Status: DC | PRN
  Administered 2011-06-18: 1 mg via INTRAVENOUS

## 2011-06-18 MED ORDER — METOCLOPRAMIDE HCL 10 MG PO TABS: 5.0000 mg | ORAL_TABLET | Freq: Three times a day (TID) | ORAL | Status: DC | PRN

## 2011-06-18 MED ORDER — MORPHINE SULFATE 4 MG/ML IJ SOLN
4.0000 mg | INTRAMUSCULAR | Status: DC | PRN
  Administered 2011-06-18 (×2): 4 mg via INTRAVENOUS
  Filled 2011-06-18 (×2): qty 1

## 2011-06-18 NOTE — Transfer of Care (Signed)
Immediate Anesthesia Transfer of Care Note  Patient: Isaac Bryant  Procedure(s) Performed: Procedure(s) (LRB): OPEN REDUCTION INTERNAL FIXATION (ORIF) TIBIA/FIBULA FRACTURE (Left)  Patient Location: PACU  Anesthesia Type: General  Level of Consciousness: awake, alert , sedated and patient cooperative  Airway & Oxygen Therapy: Patient Spontanous Breathing and Patient connected to face mask oxygen  Post-op Assessment: Report given to PACU RN, Post -op Vital signs reviewed and stable and Patient moving all extremities  Post vital signs: Reviewed and stable  Complications: No apparent anesthesia complications

## 2011-06-18 NOTE — Anesthesia Procedure Notes (Signed)
Procedure Name: Intubation Date/Time: 06/17/2011 11:52 PM Performed by: Edison Pace Pre-anesthesia Checklist: Patient identified, Timeout performed, Emergency Drugs available, Suction available and Patient being monitored Patient Re-evaluated:Patient Re-evaluated prior to inductionOxygen Delivery Method: Circle system utilized Preoxygenation: Pre-oxygenation with 100% oxygen Intubation Type: IV induction, Rapid sequence and Cricoid Pressure applied Laryngoscope Size: Mac and 4 Grade View: Grade I Tube type: Oral Tube size: 7.5 mm Number of attempts: 1 Airway Equipment and Method: Stylet Placement Confirmation: ETT inserted through vocal cords under direct vision,  positive ETCO2 and breath sounds checked- equal and bilateral Secured at: 21 cm Tube secured with: Tape Dental Injury: Teeth and Oropharynx as per pre-operative assessment

## 2011-06-18 NOTE — Op Note (Signed)
NAMEYAREL, RUSHLOW NO.:  [id number]  MEDICAL RECORD NO.:  [id number]  LOCATION:  1616                         FACILITY:  [facility]  PHYSICIAN:  Almedia Balls. Ranell Patrick, M.D. DATE OF BIRTH:  Dec 29, 1980  DATE OF PROCEDURE:  06/18/2011 DATE OF DISCHARGE:                              OPERATIVE REPORT   PREOPERATIVE DIAGNOSIS:  Left grade II open tibia (pilon) fracture.  POSTOPERATIVE DIAGNOSIS:  Left grade II open tibia (pilon) fracture.  PROCEDURES PERFORMED:  Incision and drainage and external fixation of open distal tibia and fibula fracture.  SURGEON:  Almedia Balls. Ranell Patrick, M.D.  ASSISTANT:  Lanney Gins, PA.  ANESTHESIA:  General anesthesia was used.  ESTIMATED BLOOD LOSS:  200 cc.  FLUID REPLACEMENT:  1000 cc crystalloid.  COUNTS:  Instrument counts were correct.  COMPLICATIONS:  No complications.  Perioperative antibiotics were given.  INDICATIONS:  The patient is a 31 year old male who suffered a fall from a fence.  The patient complained of immediate pain, had an open deformity to his left ankle.  The patient presents to the emergency room with a grade II open pilon fracture.  Discussed with the patient the need to perform irrigation and debridement and stabilization of the fracture site.  The patient understands, informed consent obtained.  DESCRIPTION OF PROCEDURE:  After adequate level of anesthesia was achieved, the patient positioned on a radiolucent table.  After a time- out was called, sterile prep and drape was performed on the left leg. We performed irrigation and debridement of the open tibia fracture. This was an oblique laceration measuring about 5 cm down to bone.  Once we pulse irrigated 3 L of normal saline irrigation to the fracture site, we went ahead and reduced the fracture.  I then went ahead and placed a centrally threaded 6 mm pin off the DePuy Ex-Fix set under fluoroscopic guidance.  We then placed two half pins in the tibia,  which we then placed bracket on and then put the outriggers into that and then went ahead and did our carbon fiber rod down to the calcaneal pin.  Once that was in place, we obtained final x-rays making sure that we had good alignment on the AP and lateral views.  We made sure our pin lengths were correct, and then, we tightened up the clamps.  They were then tightened up.  We thoroughly irrigated again.  We then placed Xeroform and gauze around the pin sites, closed our open wound loosely using nylon suture.  Sterile compressive bandage applied.  The patient tolerated the procedure well, was taken to recovery room.     Almedia Balls. Ranell Patrick, M.D.     SRN/MEDQ  D:  06/18/2011  T:  06/18/2011  Job:  [id number]

## 2011-06-18 NOTE — Transfer of Care (Signed)
Immediate Anesthesia Transfer of Care Note  Patient: Isaac Bryant  Procedure(s) Performed: Procedure(s) (LRB): OPEN REDUCTION INTERNAL FIXATION (ORIF) TIBIA/FIBULA FRACTURE (Left)  Patient Location: PACU  Anesthesia Type: General  Level of Consciousness: awake, alert , oriented and patient cooperative  Airway & Oxygen Therapy: Patient Spontanous Breathing and Patient connected to face mask oxygen  Post-op Assessment: Report given to PACU RN, Post -op Vital signs reviewed and stable and Patient moving all extremities  Post vital signs: Reviewed and stable  Complications: No apparent anesthesia complications

## 2011-06-18 NOTE — Progress Notes (Signed)
Isaac Bryant  MRN: [id number] DOB/Age: 33/21/1982 30 y.o. Physician: Jacquelyne Balint Procedure: Procedure(s) (LRB): OPEN REDUCTION INTERNAL FIXATION (ORIF) TIBIA/FIBULA FRACTURE (Left)     Subjective: Awake. Pain under better control with dilaudid  Vital Signs Temp:  [98.2 F (36.8 C)-98.7 F (37.1 C)] 98.7 F (37.1 C) (05/18 0525) Pulse Rate:  [79-130] 91  (05/18 0525) Resp:  [7-16] 12  (05/18 0525) BP: (111-156)/(63-78) 111/67 mmHg (05/18 0525) SpO2:  [93 %-100 %] 96 % (05/18 0525) Weight:  [83.915 kg (185 lb)-86.183 kg (190 lb)] 83.915 kg (185 lb) (05/18 0306)  Lab Results  Basename 06/17/11 2230  WBC 11.6*  HGB 15.1  HCT 42.1  PLT 210   BMET  Basename 06/17/11 2230  NA 134*  K 3.4*  CL 100  CO2 21  GLUCOSE 122*  BUN 14  CREATININE 1.18  CALCIUM 8.3*   INR  Date Value Range Status  06/17/2011 1.00  0.00-1.49 (no units) Final     Exam Left ankle dressing dry. Moving toes well        Plan Order a CT scan of left ankle. Continue IV antibiotics  Tressa Maldonado for Dr.Kevin Supple 06/18/2011, 9:37 AM

## 2011-06-18 NOTE — Brief Op Note (Signed)
06/17/2011 - 06/18/2011  12:53 AM  PATIENT:  Isaac Bryant  31 y.o. male  PRE-OPERATIVE DIAGNOSIS:  Grade II Open Left tibia fibula fracture, Pilon fx  POST-OPERATIVE DIAGNOSIS:  Grade II Open Left tibia fibula fracture, Pilon Fx  PROCEDURE:  Procedure(s) (LRB): I and D and External Fixator placement of TIBIA/FIBULA FRACTURE (Left)  SURGEON:  Surgeon(s) and Role:    * Verlee Rossetti, MD - Primary  PHYSICIAN ASSISTANT:   ASSISTANTS: Lanney Gins, PA-C   ANESTHESIA:   general  EBL:100     BLOOD ADMINISTERED:none  DRAINS: none   LOCAL MEDICATIONS USED:  NONE  SPECIMEN:  No Specimen  DISPOSITION OF SPECIMEN:  N/A  COUNTS:  YES  TOURNIQUET:  * No tourniquets in log *  DICTATION: .Other Dictation: Dictation Number [id number]  PLAN OF CARE: Admit to inpatient   PATIENT DISPOSITION:  PACU - hemodynamically stable.   Delay start of Pharmacological VTE agent (>24hrs) due to surgical blood loss or risk of bleeding: not applicable

## 2011-06-18 NOTE — Preoperative (Signed)
Beta Blockers   Reason not to administer Beta Blockers:Not Applicable 

## 2011-06-18 NOTE — Progress Notes (Signed)
Report to charelle rn, No new drg on dsg

## 2011-06-18 NOTE — Anesthesia Postprocedure Evaluation (Signed)
  Anesthesia Post-op Note  Patient: Isaac Bryant  Procedure(s) Performed: Procedure(s) (LRB): OPEN REDUCTION INTERNAL FIXATION (ORIF) TIBIA/FIBULA FRACTURE (Left)  Patient Location: PACU  Anesthesia Type: General  Level of Consciousness: awake and alert   Airway and Oxygen Therapy: Patient Spontanous Breathing  Post-op Pain: mild  Post-op Assessment: Post-op Vital signs reviewed, Patient's Cardiovascular Status Stable, Respiratory Function Stable, Patent Airway and No signs of Nausea or vomiting  Post-op Vital Signs: stable  Complications: No apparent anesthesia complications

## 2011-06-18 NOTE — Progress Notes (Signed)
PT Cancellation Note  Treatment cancelled today due to patient's refusal to participate due to pain and no sleep last night. Pain is 8/10 at rest with pain meds, pt states pain is excruciating with movement. Instructed pt in R ankle pumps.  Will re-attempt tomorrow. Discussed importance of mobility.  Ralene Bathe Kistler 06/18/2011, 1:43 PM [phone]

## 2011-06-18 NOTE — Progress Notes (Signed)
Patient's pain level has been 10/10 or 9/10 since arrival to floor. Patient has had everything has has available and pain has not decreased. On call PA [facility] paged with orders to D/C percocet and Morphine and start diludad 0.5-2mg  Q2h PRN for pain and oxy 5-15mg  q4H PRN for pain. Will continue to monitor patient.

## 2011-06-19 MED ORDER — NICOTINE 7 MG/24HR TD PT24
7.0000 mg | MEDICATED_PATCH | Freq: Every day | TRANSDERMAL | Status: DC
  Administered 2011-06-19 – 2011-06-22 (×4): 7 mg via TRANSDERMAL
  Filled 2011-06-19 (×5): qty 1

## 2011-06-19 NOTE — Evaluation (Signed)
Physical Therapy Evaluation Patient Details Name: Isaac Bryant MRN: [id number] DOB: 1980-12-24 Today's Date: 06/19/2011 Time: 1308-6578 PT Time Calculation (min): 36 min  PT Assessment / Plan / Recommendation Clinical Impression  31 y.o. with open fx of L  tib/fib 2* fall. Pt has external fixation.  Pain limiting activity tolerance, but pt moved well with pivot transfer to chair. Will need WC with elevating leg rest, RW, BSC.     PT Assessment  Patient needs continued PT services    Follow Up Recommendations  Home health PT    Barriers to Discharge None      lEquipment Recommendations  Wheelchair (measurements);Rolling walker with 5" wheels;Hospital bed;3 in 1 bedside comode    Recommendations for Other Services OT consult   Frequency 7X/week    Precautions / Restrictions Restrictions Weight Bearing Restrictions: Yes LLE Weight Bearing: Non weight bearing   Pertinent Vitals/Pain *10/10 L ankle; ice applied, pt premedicated, LE elevated**      Mobility  Bed Mobility Bed Mobility: Supine to Sit Supine to Sit: 4: Min assist Details for Bed Mobility Assistance: min A to support LLE Transfers Transfers: Pharmacologist;Sit to Stand;Stand to Sit Sit to Stand: 1: +2 Total assist;From bed;With upper extremity assist Sit to Stand: Patient Percentage: 80% Stand to Sit: 1: +2 Total assist;With upper extremity assist;With armrests;To chair/3-in-1 Stand to Sit: Patient Percentage: 80% Stand Pivot Transfers: 1: +2 Total assist Stand Pivot Transfers: Patient Percentage: 80% Details for Transfer Assistance: bed to recliner stand pivot transfer with +2 assist to support LLE and manage IV, pt 80% Ambulation/Gait Ambulation/Gait Assistance Details: deferred due to pain    Exercises     PT Diagnosis: Difficulty walking;Acute pain  PT Problem List: Decreased strength;Decreased activity tolerance;Pain;Decreased mobility;Decreased knowledge of use of DME PT Treatment  Interventions: DME instruction;Gait training;Functional mobility training;Patient/family education;Wheelchair mobility training;Therapeutic exercise;Therapeutic activities   PT Goals Acute Rehab PT Goals PT Goal Formulation: With patient/family Time For Goal Achievement: 06/26/11 Potential to Achieve Goals: Good Pt will go Supine/Side to Sit: Independently;with HOB 0 degrees PT Goal: Supine/Side to Sit - Progress: Goal set today Pt will go Sit to Stand: with supervision PT Goal: Sit to Stand - Progress: Goal set today Pt will Transfer Bed to Chair/Chair to Bed: with supervision PT Transfer Goal: Bed to Chair/Chair to Bed - Progress: Goal set today Pt will Ambulate: 1 - 15 feet;with least restrictive assistive device PT Goal: Ambulate - Progress: Goal set today Pt will Propel Wheelchair: 51 - 150 feet;with modified independence PT Goal: Propel Wheelchair - Progress: Goal set today  Visit Information  Last PT Received On: 06/19/11 Assistance Needed: +2 (+2 to manage LLE and IV)    Subjective Data  Subjective: It hurts when I move it. I can feel the bones moving in there. 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 Communication Communication: No difficulties    Cognition  Overall Cognitive Status: Appears within functional limits for tasks assessed/performed Arousal/Alertness: Awake/alert Orientation Level: Appears intact for tasks assessed Behavior During Session: [facility] for tasks performed    Extremity/Trunk Assessment Right Upper Extremity Assessment RUE ROM/Strength/Tone:  Within functional levels RUE Sensation: WFL -  Light Touch RUE Coordination: WFL - gross/fine motor Left Upper Extremity Assessment LUE ROM/Strength/Tone: Within functional levels LUE Sensation: WFL - Light Touch LUE Coordination: WFL - gross/fine motor Right Lower Extremity Assessment RLE ROM/Strength/Tone: Within functional levels RLE Sensation: WFL - Light Touch RLE Coordination: WFL - gross/fine motor Left Lower Extremity Assessment LLE ROM/Strength/Tone: Unable to fully assess;Due to pain (pt able to assist with SLR, limited assessment 2* pain) LLE Sensation: WFL - Light Touch Trunk Assessment Trunk Assessment: Normal   Balance    End of Session PT - End of Session Activity Tolerance: Patient limited by pain Patient left: in chair;with call bell/phone within reach;with family/visitor present Nurse Communication: Mobility status   Tamala Ser 06/19/2011, 11:45 AM [phone]

## 2011-06-19 NOTE — Progress Notes (Addendum)
Cm spoke with pt with fiance at bedside concerning dc planning. Per pt choice Genevieve Norlander to provide [facility] services. Referral faxed to [facility] intake @ [phone] Per request hospital bed,3n1,RW. PT suggest wheelchair, pt's insurance will not cover both wheelchair & RW. [facility] notified of referral. Pt offered choice for [facility] services. DME delivery scheduled to residence upon pt's d/c.   Leonie Green [phone]

## 2011-06-19 NOTE — Progress Notes (Signed)
Physical Therapy Treatment Patient Details Name: Isaac Bryant MRN: [id number] DOB: 1980/12/28 Today's Date: 06/19/2011 Time: 4098-1191 PT Time Calculation (min): 12 min  PT Assessment / Plan / Recommendation Comments on Treatment Session  Pt very pleasant and motivated to progress, activity tolerance limited by pain. Good progress with mobility today.     Follow Up Recommendations  Home health PT    Barriers to Discharge None      Equipment Recommendations  Wheelchair (measurements);Rolling walker with 5" wheels;Hospital bed;3 in 1 bedside comode    Recommendations for Other Services OT consult  Frequency 7X/week   Plan Discharge plan remains appropriate    Precautions / Restrictions Restrictions Weight Bearing Restrictions: Yes LLE Weight Bearing: Non weight bearing   Pertinent Vitals/Pain *10/10 L ankle/foot with mobility, 8/10 at rest; pt premedicated, ice applied, LLE elevated**    Mobility  Bed Mobility Bed Mobility: Sit to Supine Supine to Sit: 4: Min assist Sit to Supine: 4: Min assist Details for Bed Mobility Assistance: min A to support LLE Transfers Transfers: Pharmacologist;Sit to Stand;Stand to Sit Sit to Stand: 1: +2 Total assist;From bed;With upper extremity assist Sit to Stand: Patient Percentage: 80% Stand to Sit: 1: +2 Total assist;With upper extremity assist;With armrests;To chair/3-in-1 Stand to Sit: Patient Percentage: 80% Stand Pivot Transfers: 1: +2 Total assist Stand Pivot Transfers: Patient Percentage: 80% Details for Transfer Assistance: recliner to bed stand pivot transfer with +2 assist to support LLE and manage IV, pt 80% with heavy use of UEs on armrests/bedrail Ambulation/Gait Ambulation/Gait Assistance Details: deferred due to pain    Exercises     PT Diagnosis: Difficulty walking;Acute pain  PT Problem List: Decreased strength;Decreased activity tolerance;Pain;Decreased mobility;Decreased knowledge of use of DME PT  Treatment Interventions: DME instruction;Gait training;Functional mobility training;Patient/family education;Wheelchair mobility training;Therapeutic exercise;Therapeutic activities   PT Goals Acute Rehab PT Goals PT Goal Formulation: With patient/family Time For Goal Achievement: 06/26/11 Potential to Achieve Goals: Good Pt will go Supine/Side to Sit: Independently;with HOB 0 degrees PT Goal: Supine/Side to Sit - Progress: Goal set today Pt will go Sit to Stand: with supervision PT Goal: Sit to Stand - Progress: Goal set today Pt will Transfer Bed to Chair/Chair to Bed: with supervision PT Transfer Goal: Bed to Chair/Chair to Bed - Progress: Goal set today Pt will Ambulate: 1 - 15 feet;with least restrictive assistive device PT Goal: Ambulate - Progress: Goal set today Pt will Propel Wheelchair: 51 - 150 feet;with modified independence PT Goal: Propel Wheelchair - Progress: Goal set today  Visit Information  Last PT Received On: 06/19/11 Assistance Needed: +2    Subjective Data  Subjective: How long am I going to be in the hospital? Patient Stated Goal: be able to walk   Cognition  Overall Cognitive Status: Appears within functional limits for tasks assessed/performed Arousal/Alertness: Awake/alert Orientation Level: Appears intact for tasks assessed Behavior During Session: [facility] for tasks performed    Balance     End of Session PT - End of Session Activity Tolerance: Patient limited by pain Patient left: with call bell/phone within reach;with family/visitor present;in bed Nurse Communication: Mobility status    Ralene Bathe Kistler 06/19/2011, 1:33 PM [phone]

## 2011-06-19 NOTE — Progress Notes (Signed)
Physical therapy recommends hospital bed in PT Evaluation of pt's mobility,ROM, & barriers to discharge. Patient had ORIF of left ankle which requires elevation of left lower ext. HOB must be 30 degrees or more with elevated left lower ext not feasible with normal bed due to pt's pain limiting activity tolerance.   Isaac Bryant [phone]

## 2011-06-19 NOTE — Progress Notes (Signed)
Isaac Bryant 31 y.o. 06/17/2011  Open Left tibia fibula fracture  2 Days Post-Op   Subjective Patient complaints:doing well without problems  Objective Ex-fix stable  Neuro exam: grossly normal Vascular: peripheral pulses symmetrical Abdomen: abdomen is soft without significant tenderness, masses, organomegaly or guarding Wound: dressing C/D/I and no drainage  Lab. Results: Basename   06/17/11             2230      WBC        11.6*     HGB        15.1      HCT        42.1      PLT        210       BMET Basename   06/17/11             2230      NA         134*      K          3.4*      CL         100       CO2        21        GLUCOSE    122*      BUN        14        CREATININE 1.18      CALCIUM    8.3*       INR (no units)  Date                       Value       Range   Status  06/17/2011                   1.00    Final ---------- VITALS ---------------------------              06/19/11                     0520        ---------------------------  BP:          111/66        Pulse:         65          Temp:   98.1 F (36.7 C)  Resp:          12         ---------------------------  Dg Tibia/fibula Left  06/18/2011  *RADIOLOGY REPORT*  Clinical Data: ORIF ankle fracture  LEFT TIBIA AND FIBULA - 2 VIEW  Comparison: Preoperative imaging 05/1968 been  Findings: Six intraoperative images demonstrate incomplete visualization of external fixation hardware presumably related to that of ankle fractures.  Comminuted distal tibial and fibular fractures again noted malalignment at the mortise.  IMPRESSION: Incomplete visualization of posterior fixation hardware fixing bimalleolar fractures.  Original Report Authenticated By: Harrel Lemon, M.D.   Dg Tibia/fibula Left  06/17/2011  *RADIOLOGY REPORT*  Clinical Data: Distal left leg pain status post trauma.  LEFT TIBIA AND FIBULA - 2 VIEW  Comparison: Contemporaneous ankle  Findings: See  contemporaneous ankle radiograph report regarding the ankle.  No proximal fractures identified.  The knee joint appears intact without effusion.  IMPRESSION: Complex ankle fracture as described on contemporaneous ankle radiograph.  No proximal fracture identified.  Original Report Authenticated By: Osborne Oman.  [facility], M.D.   Dg Ankle Complete Left  06/17/2011  *RADIOLOGY REPORT*  Clinical Data: Left ankle pain  LEFT ANKLE COMPLETE - 3+ VIEW  Comparison: None.  Findings: Comminuted fractures of the distal tibia and fibula with lateral displacement of the distal components.  The proximal tibia extends to the skin surface, and is likely open. The tibial fracture line extends to involve the posterior malleolus and into the tibiotalar joint. There is mild lateral joint space widening and ankle mortise disruption is not excluded.  A talar fracture is not definitively identified.  No calcaneal fracture identified.  IMPRESSION: Complex fracture of the distal tibia and fibula as described above.  Original Report Authenticated By: Waneta Martins, M.D.   Ct Tibia Fibula Left Wo Contrast  06/18/2011  *RADIOLOGY REPORT*  Clinical Data: Comminuted fractures of the distal left tibia and fibula.  CT TIBIA FIBULA LEFT WITHOUT CONTRAST  Comparison: Radiographs dated 06/17/2011 and intraoperative radiographs dated 06/18/2011  Findings: There is a comminuted fracture of the distal tibia with extensive involvement of the articular surface.  Alignment of the fracture fragments along the articular surface is near anatomic. There is slight anterior impaction and overriding of the proximal portion of the fracture. There is a vertical component of the fracture which goes through the anterior aspect of the medial malleolus with no displacement.  There is only minimal displacement and overriding of the distal fibula fracture.  Tiny old avulsion from the tip of the lateral malleolus.  The talus and the visualized portion of the  calcaneus are intact.   No entrapment of tendons. However, a small portion of the extensor digitorum longus muscle extends into the anterior lateral aspect of the tibial fracture best seen on images 52 through 66 of series 4.   The anterior talofibular ligament appears to be disrupted. Posterior talofibular ligament is intact.  There is air in the soft tissues around the fractures may be due to surgery or due to an open fracture.  IMPRESSION: Comminuted fractures of the tibia and fibula as described. No significant angulation or displacement.  A portion of the extensor digitorum longus muscle extends in to the distal tibial fracture.  Probable disruption of the anterior talofibular ligament.  Original Report Authenticated By: Gwynn Burly, M.D.   Dg C-arm 1-60 Min-no Report  06/18/2011  CLINICAL DATA: ORIF tib fib fracture   C-ARM 1-60 MINUTES  Fluoroscopy was utilized by the requesting physician.  No radiographic  interpretation.       Assessment/ Plan Patient: Doing well postoperatively. Plan: Encourage ambulation & incentive spirometer Discharge condition: Good Discharge destination: Home Plan per Dr Ranell Patrick Elevation and observation  Isaac Bryant 5/19/20138:46 AM

## 2011-06-20 MED ORDER — HYDROMORPHONE HCL PF 1 MG/ML IJ SOLN: 1.0000 mg | INTRAMUSCULAR | Status: DC | PRN

## 2011-06-20 MED ORDER — SENNA 8.6 MG PO TABS
2.0000 | ORAL_TABLET | Freq: Two times a day (BID) | ORAL | Status: DC
  Administered 2011-06-20 – 2011-06-22 (×4): 17.2 mg via ORAL
  Filled 2011-06-20 (×11): qty 2

## 2011-06-20 MED ORDER — HYDROMORPHONE HCL PF 1 MG/ML IJ SOLN
1.0000 mg | INTRAMUSCULAR | Status: DC | PRN
  Administered 2011-06-20 (×2): 1 mg via INTRAVENOUS
  Administered 2011-06-21 – 2011-06-23 (×20): 2 mg via INTRAVENOUS
  Administered 2011-06-23: 1 mg via INTRAVENOUS
  Administered 2011-06-23 – 2011-06-24 (×2): 2 mg via INTRAVENOUS
  Administered 2011-06-24: 1 mg via INTRAVENOUS
  Filled 2011-06-20 (×16): qty 2
  Filled 2011-06-20: qty 1
  Filled 2011-06-20 (×8): qty 2
  Filled 2011-06-20: qty 1

## 2011-06-20 MED ORDER — DOCUSATE SODIUM 100 MG PO CAPS
100.0000 mg | ORAL_CAPSULE | Freq: Two times a day (BID) | ORAL | Status: DC
  Administered 2011-06-20 – 2011-06-22 (×4): 100 mg via ORAL
  Filled 2011-06-20 (×11): qty 1

## 2011-06-20 MED ORDER — OXYCODONE HCL 20 MG PO TB12
20.0000 mg | ORAL_TABLET | Freq: Two times a day (BID) | ORAL | Status: DC
  Administered 2011-06-20 – 2011-06-23 (×6): 20 mg via ORAL
  Filled 2011-06-20 (×6): qty 1

## 2011-06-20 MED ORDER — POTASSIUM CHLORIDE IN NACL 20-0.9 MEQ/L-% IV SOLN
INTRAVENOUS | Status: DC
  Administered 2011-06-20: 20:00:00 via INTRAVENOUS
  Administered 2011-06-22: 20 mL/h via INTRAVENOUS
  Filled 2011-06-20 (×2): qty 1000

## 2011-06-20 MED ORDER — METHOCARBAMOL 500 MG PO TABS
500.0000 mg | ORAL_TABLET | Freq: Four times a day (QID) | ORAL | Status: DC | PRN
  Administered 2011-06-20 – 2011-06-25 (×17): 500 mg via ORAL
  Filled 2011-06-20 (×17): qty 1

## 2011-06-20 MED ORDER — ENOXAPARIN SODIUM 40 MG/0.4ML ~~LOC~~ SOLN
40.0000 mg | SUBCUTANEOUS | Status: DC
  Administered 2011-06-21 – 2011-06-22 (×2): 40 mg via SUBCUTANEOUS
  Filled 2011-06-20 (×3): qty 0.4

## 2011-06-20 MED FILL — Cisatracurium Besylate (PF) IV Soln 10 MG/5ML (2 MG/ML): INTRAVENOUS | Qty: 5 | Status: AC

## 2011-06-20 NOTE — Progress Notes (Signed)
Orthopedics Progress Note  Subjective: I still hurt a lot. Can you increase my pain medication  Objective:  Filed Vitals:   06/20/11 1700  BP: 137/86  Pulse: 79  Temp: 99.6 F (37.6 C)  Resp: 20    General: Awake and alert  Musculoskeletal: left leg elevated above heart level.  X-FIX in place with moderately swollen foot. Compartments supple. NO pain with passive strech. Diminished AROM of the toes due to pain.. Foot well perfused. Neurovascularly intact  Lab Results  Component Value Date   WBC 11.6* 06/17/2011   HGB 15.1 06/17/2011   HCT 42.1 06/17/2011   MCV 88.4 06/17/2011   PLT 210 06/17/2011       Component Value Date/Time   NA 134* 06/17/2011 2230   K 3.4* 06/17/2011 2230   CL 100 06/17/2011 2230   CO2 21 06/17/2011 2230   GLUCOSE 122* 06/17/2011 2230   BUN 14 06/17/2011 2230   CREATININE 1.18 06/17/2011 2230   CALCIUM 8.3* 06/17/2011 2230   GFRNONAA 80* 06/17/2011 2230   GFRAA >90 06/17/2011 2230    Lab Results  Component Value Date   INR 1.00 06/17/2011    Assessment/Plan: POD #2 s/p Procedure(s): I+D  TIBIA/FIBULA FRACTURE, PLACEMENT X-FIX Appreciate Dr Victorino Dike taking over for this difficult injury.  Almedia Balls. Ranell Patrick, MD 06/20/2011 6:18 PM

## 2011-06-20 NOTE — Progress Notes (Signed)
Subjective: 31 y/o male now pod 3 s/p I and d of left open distal tib fib fx.  Pt c/o pain but denies f/c/n/v.  + smoking hx but no h/o diabetes or hypothyroidism.  Objective: Vital signs in last 24 hours: Temp:  [97.6 F (36.4 C)-98.8 F (37.1 C)] 98.3 F (36.8 C) (05/20 0545) Pulse Rate:  [76-82] 80  (05/20 0545) Resp:  [12-15] 15  (05/20 0545) BP: (121-128)/(72-82) 128/82 mmHg (05/20 0545) SpO2:  [94 %-96 %] 96 % (05/20 0545)  Intake/Output from previous day: 05/19 0701 - 05/20 0700 In: 2659.3 [P.O.:1480; I.V.:779.3; IV Piggyback:400] Out: 1650 [Urine:1650] Intake/Output this shift: Total I/O In: 454.7 [P.O.:240; I.V.:214.7] Out: 850 [Urine:850]   Basename 06/17/11 2230  HGB 15.1    Basename 06/17/11 2230  WBC 11.6*  RBC 4.76  HCT 42.1  PLT 210    Basename 06/17/11 2230  NA 134*  K 3.4*  CL 100  CO2 21  BUN 14  CREATININE 1.18  GLUCOSE 122*  CALCIUM 8.3*    Basename 06/17/11 2230  LABPT --  INR 1.00    L LE with ex fix in place.  No signs of infection.  wiggles toes in PF and DF.  Feels LT plantarly and dorsally.  Assessment/Plan: L open distal tibia and fibula fractures - to OR Thursday for ORIF.  Will tx pt to [facility].  Pt understands the plan and agrees.   Toni Arthurs 06/20/2011, 12:57 PM

## 2011-06-20 NOTE — Care Management Note (Addendum)
    Page 1 of 2   06/24/2011     4:04:42 PM   CARE MANAGEMENT NOTE 06/24/2011  Patient:  Isaac Bryant, Isaac Bryant   Account Number:  [id number]  Date Initiated:  06/19/2011  Documentation initiated by:  DAVIS,TYMEEKA  Subjective/Objective Assessment:   31 yo male admitted s/p I+D Left open ankle fracture planned with temporary X FIX. PTA pt lived with fiance.     Action/Plan:   Home when stable   Anticipated DC Date:  06/21/2011   Anticipated DC Plan:  HOME W HOME HEALTH SERVICES  In-house referral  NA      DC Planning Services  NA      [facility] Choice  NA   Choice offered to / List presented to:  C-1 Patient   DME arranged  3-N-1  WALKER - ROLLING  WHEELCHAIR - MANUAL      DME agency  [employer]     HH arranged  HH-2 PT      HH agency  [facility]   Status of service:  Completed, signed off Medicare Important Message given?   (If response is "NO", the following Medicare IM given date fields will be blank) Date Medicare IM given:   Date Additional Medicare IM given:    Discharge Disposition:  HOME W HOME HEALTH SERVICES  Per UR Regulation:  Reviewed for med. necessity/level of care/duration of stay  If discussed at Long Length of Stay Meetings, dates discussed:    Comments:  06/24/11  16:02 Anette Guarneri RN/CM spoke with patient and spouse, per Patient, he does not want hospital bed, also per patient he has been told by [employer] rep. that they will deliver 3N1, patient already has RW and WC. Patient wants to continue with PT per [place].  Referral has been made to [facility]   06/20/2011 Raynelle Bring [place] [phone] Per Care Co-ordinator -current plans are for patient to transfr to [facility]unit[facility] .Per MD progress notes pt to have ORIF Thurs. [place] notified of  plans for transfer. DME will be handled by [place]. [employer] notified of change of dme supplier.    06/19/11 1200 Leonie Green [phone] Cm spoke with pt with fiance at  bedside concerning dc planning. Per pt choice Genevieve Norlander to provide [facility] services. Referral faxed to [facility] intake @ [phone] Per request hospital bed,3n1,RW. PT suggest wheelchair, pt's insurance will not cover both wheelchair & RW. [facility] notified of referral. Pt offered choice for [facility] services. DME delivery scheduled to residence upon pt's d/c.

## 2011-06-20 NOTE — Progress Notes (Signed)
PHYSICAL THERAPY PROGRESS NOTE 06/20/11 Pt seen x 3 today  1. 10:50 - 11:05       1 hm      Bed mobility MinGuard Assist to reposition L LE with education on elevation and use of ICE for pain/swelling. TE's and education on toe wiggles and grasping to increase circulation and decrease swelling. Home management questions addressed about getting in/out of the car, negotiating narrow bathroom door and use of hospital bed @ home. Pt c/o 6/10 pain, applied ICE and elevation   2. 12:30 - 12:45     1 ta   Bed mobility  MinGuard assist supine to sit to EOB with increased time and VC's on safety/proper placement L LE due to external fixaters. Transfer min assist from bed to recliner 1/4 turn to R w/o AD.  Instructed pt on hand placement and R LE placement to pivot while maintaining NWB L LE.  Positioned to comfort and elevation.   3. 13:30 - 13:40   1 ta  Transfer min assist to transfer from recliner to bed 1/4 turn towards his R w/o AD while maintaining NWB L LE.  25% VC's on proper hand placement.  Pt able to lift L LE @ Supervision on to bed.  Bed mobility Assisted with repositioning and elevation.  Applied ICE pack.   Pt plans to transfer to [facility] for surgery tomorrow. Felecia Shelling  PTA [facility]  Acute  Rehab Pager     [phone]

## 2011-06-20 NOTE — Progress Notes (Signed)
Orthopedics Progress Note  Subjective: Pt resting with mild pain and soreness to left ankle/foot Denies any other symptoms  Objective:  Filed Vitals:   06/20/11 0545  BP: 128/82  Pulse: 80  Temp: 98.3 F (36.8 C)  Resp: 15    General: Awake and alert  Musculoskeletal: left ankle/foot with external fixator in place, minimal edema, nv intact distally Neurovascularly intact  Lab Results  Component Value Date   WBC 11.6* 06/17/2011   HGB 15.1 06/17/2011   HCT 42.1 06/17/2011   MCV 88.4 06/17/2011   PLT 210 06/17/2011       Component Value Date/Time   NA 134* 06/17/2011 2230   K 3.4* 06/17/2011 2230   CL 100 06/17/2011 2230   CO2 21 06/17/2011 2230   GLUCOSE 122* 06/17/2011 2230   BUN 14 06/17/2011 2230   CREATININE 1.18 06/17/2011 2230   CALCIUM 8.3* 06/17/2011 2230   GFRNONAA 80* 06/17/2011 2230   GFRAA >90 06/17/2011 2230    Lab Results  Component Value Date   INR 1.00 06/17/2011    Assessment/Plan: POD #3 s/p Procedure(s): OPEN REDUCTION INTERNAL FIXATION (ORIF) TIBIA/FIBULA FRACTURE  Dr. Victorino Dike to take over care  Will await his recommendations about definitive fixation for the left ankle Pain control  Almedia Balls. Ranell Patrick, MD 06/20/2011 10:07 AM

## 2011-06-21 NOTE — Progress Notes (Signed)
Subjective: 4 Days Post-Op Procedure(s) (LRB): OPEN REDUCTION INTERNAL FIXATION (ORIF) TIBIA/FIBULA FRACTURE (Left) Patient reports pain as improved with oxycontin and oxycodone.  No BM on this admission.  Started colace and senokot yesterday.  Pt feels like he can move his bowels.  Objective: Vital signs in last 24 hours: Temp:  [98.4 F (36.9 C)-100.2 F (37.9 C)] 98.9 F (37.2 C) (05/21 1300) Pulse Rate:  [81-83] 83  (05/21 1300) Resp:  [18] 18  (05/21 1300) BP: (120-141)/(64-87) 120/76 mmHg (05/21 1300) SpO2:  [96 %-99 %] 98 % (05/21 1300)  Intake/Output from previous day: 05/20 0701 - 05/21 0700 In: 2149.3 [P.O.:1260; I.V.:889.3] Out: 1925 [Urine:1925] Intake/Output this shift: Total I/O In: 240 [P.O.:240] Out: 800 [Urine:800]    L LE ex fix in place.  No signs of infection.  Pin sites dressed ad dry.  Assessment/Plan: 4 Days Post-Op Procedure(s) (LRB): OPEN REDUCTION INTERNAL FIXATION (ORIF) TIBIA/FIBULA FRACTURE (Left) Continue elevation of L LE.  Plan OR Thursday for ORIF.  Encouraged BM.  Toni Arthurs 06/21/2011, 6:53 PM

## 2011-06-21 NOTE — ED Provider Notes (Signed)
Medical screening examination/treatment/procedure(s) were performed by non-physician practitioner and as supervising physician I was immediately available for consultation/collaboration.    Melisha Eggleton L Kemari Narez, MD 06/21/11 1639 

## 2011-06-21 NOTE — Progress Notes (Signed)
Physical Therapy Treatment Patient Details Name: Isaac Bryant MRN: [id number] DOB: Feb 15, 1980 Today's Date: 06/21/2011 Time: 1025-1040 PT Time Calculation (min): 15 min  PT Assessment / Plan / Recommendation Comments on Treatment Session  Pt required encouragement to participate initially but did really well with mobility.  "Im glad you pushed me because I would have layed in bed all day".      Follow Up Recommendations  Home health PT    Barriers to Discharge        Equipment Recommendations  Wheelchair (measurements);Rolling walker with 5" wheels;Hospital bed;3 in 1 bedside comode    Recommendations for Other Services    Frequency 7X/week   Plan Discharge plan remains appropriate    Precautions / Restrictions Restrictions Weight Bearing Restrictions: Yes LLE Weight Bearing: Non weight bearing   Pertinent Vitals/Pain 7/10 ankle    Mobility  Bed Mobility Bed Mobility: Sit to Supine Supine to Sit: 6: Modified independent (Device/Increase time);HOB flat Transfers Transfers: Sit to Stand;Stand to Sit Sit to Stand: 4: Min guard;With upper extremity assist;From bed Stand to Sit: 4: Min guard;With upper extremity assist;With armrests;To chair/3-in-1 Details for Transfer Assistance: cues for safe hand placement & body positioning before sitting.  Pt did well with I'ly managing LLE.  Ambulation/Gait Ambulation/Gait Assistance: 4: Min guard Ambulation Distance (Feet): 15 Feet Assistive device: Rolling walker Ambulation/Gait Assistance Details: Guarding for safety.  Pt did well with maintaining NWBing LLE.   Gait Pattern: Step-to pattern Stairs: No    Exercises     PT Diagnosis:    PT Problem List:   PT Treatment Interventions:     PT Goals Acute Rehab PT Goals PT Goal: Supine/Side to Sit - Progress: Progressing toward goal PT Goal: Sit to Stand - Progress: Progressing toward goal PT Goal: Ambulate - Progress: Progressing toward goal  Visit Information  Last PT  Received On: 06/21/11 Assistance Needed: +1    Subjective Data  Subjective: "it hurts"   Cognition  Overall Cognitive Status: Appears within functional limits for tasks assessed/performed Arousal/Alertness: Awake/alert Orientation Level: Appears intact for tasks assessed Behavior During Session: [facility] for tasks performed    Balance     End of Session PT - End of Session Equipment Utilized During Treatment: Gait belt Activity Tolerance: Patient tolerated treatment well Patient left: in chair;with call bell/phone within reach    Lara Mulch 06/21/2011, 10:53 AM  Verdell Face, PTA [phone] 06/21/2011

## 2011-06-22 LAB — COMPREHENSIVE METABOLIC PANEL
ALT: 23 U/L (ref 0–53)
AST: 26 U/L (ref 0–37)
Alkaline Phosphatase: 63 U/L (ref 39–117)
CO2: 31 mEq/L (ref 19–32)
GFR calc Af Amer: >90 mL/min (ref >90)
GFR calc non Af Amer: >90 mL/min (ref >90)
Glucose, Bld: 97 mg/dL (ref 70–99)
Potassium: 4.5 mEq/L (ref 3.5–5.1)
Sodium: 136 mEq/L (ref 135–145)

## 2011-06-22 LAB — CBC
HCT: 41 % (ref 39.0–52.0)
MCH: 31 pg (ref 26.0–34.0)
MCHC: 34.9 g/dL (ref 30.0–36.0)
RDW: 12.1 % (ref 11.5–15.5)

## 2011-06-22 MED ORDER — SODIUM CHLORIDE 0.9 % IV SOLN: INTRAVENOUS | Status: DC

## 2011-06-22 MED ORDER — LACTATED RINGERS IV SOLN
INTRAVENOUS | Status: DC
  Administered 2011-06-23: 01:00:00 via INTRAVENOUS

## 2011-06-22 MED ORDER — MIDAZOLAM HCL 2 MG/2ML IJ SOLN: 1.0000 mg | INTRAMUSCULAR | Status: DC | PRN

## 2011-06-22 MED ORDER — CEFAZOLIN SODIUM-DEXTROSE 2-3 GM-% IV SOLR
2.0000 g | INTRAVENOUS | Status: AC
  Administered 2011-06-23: 2 g via INTRAVENOUS
  Filled 2011-06-22 (×2): qty 50

## 2011-06-22 MED ORDER — CHLORHEXIDINE GLUCONATE 4 % EX LIQD
60.0000 mL | Freq: Once | CUTANEOUS | Status: DC
  Filled 2011-06-22: qty 60

## 2011-06-22 MED ORDER — FENTANYL CITRATE 0.05 MG/ML IJ SOLN: 50.0000 ug | INTRAMUSCULAR | Status: DC | PRN

## 2011-06-22 NOTE — Progress Notes (Signed)
Physical Therapy Treatment Patient Details Name: Isaac Bryant MRN: [id number] DOB: January 30, 1981 Today's Date: 06/22/2011 Time: 0865-7846 PT Time Calculation (min): 16 min  PT Assessment / Plan / Recommendation Comments on Treatment Session  Pt continues to do well with mobility despite c/o increased pain in Lt ankle.  Due to pt mobilizing well he may do well with use of crutches- will need to assess.  Plan to also begin W/C education.    Follow Up Recommendations  Home health PT    Barriers to Discharge        Equipment Recommendations  Wheelchair (measurements);Rolling walker with 5" wheels;Hospital bed;3 in 1 bedside comode    Recommendations for Other Services OT consult  Frequency 7X/week   Plan Discharge plan remains appropriate    Precautions / Restrictions Restrictions Weight Bearing Restrictions: Yes LLE Weight Bearing: Non weight bearing   Pertinent Vitals/Pain 8/10 Rt ankle    Mobility  Bed Mobility Bed Mobility: Supine to Sit;Sit to Supine Supine to Sit: 6: Modified independent (Device/Increase time);HOB flat Sit to Supine: 6: Modified independent (Device/Increase time);HOB flat Transfers Transfers: Sit to Stand;Stand to Sit Sit to Stand: 5: Supervision;With upper extremity assist;From bed Stand to Sit: 5: Supervision;With upper extremity assist;To bed Details for Transfer Assistance: Cues for safe body positioning before sitting & to make sure RW is in position before standing.   Ambulation/Gait Ambulation/Gait Assistance: 5: Supervision Ambulation Distance (Feet): 40 Feet Assistive device: Rolling walker Ambulation/Gait Assistance Details: Pt ambulated bed<>door 2x's today.  Supervision for safety & awareness of IV line with turns but for most part pt demonstrates safe technique & takes his time.   Gait Pattern: Step-to pattern Stairs: No    Exercises     PT Diagnosis:    PT Problem List:   PT Treatment Interventions:     PT Goals Acute Rehab PT  Goals Time For Goal Achievement: 06/26/11 Potential to Achieve Goals: Good PT Goal: Supine/Side to Sit - Progress: Progressing toward goal PT Goal: Sit to Stand - Progress: Met PT Goal: Ambulate - Progress: Progressing toward goal  Visit Information  Last PT Received On: 06/22/11 Assistance Needed: +1    Subjective Data      Cognition  Overall Cognitive Status: Appears within functional limits for tasks assessed/performed Arousal/Alertness: Awake/alert Orientation Level: Appears intact for tasks assessed Behavior During Session: [facility] for tasks performed    Balance     End of Session PT - End of Session Equipment Utilized During Treatment: Gait belt Activity Tolerance: Patient tolerated treatment well Patient left: in bed;with call bell/phone within reach    Lara Mulch 06/22/2011, 3:00 PM   Verdell Face, PTA [phone] 06/22/2011

## 2011-06-22 NOTE — Progress Notes (Signed)
Subjective: 5 Days Post-Op Procedure(s) (LRB): OPEN REDUCTION INTERNAL FIXATION (ORIF) TIBIA/FIBULA FRACTURE (Left) Patient reports pain as mild.  Well controlled with oxycontin and oxycodone.  No f/c/n/v.  Objective: Vital signs in last 24 hours: Temp:  [98.2 F (36.8 C)-98.5 F (36.9 C)] 98.2 F (36.8 C) (05/22 0624) Pulse Rate:  [80-84] 80  (05/22 0624) Resp:  [18] 18  (05/22 0624) BP: (124-144)/(73-79) 124/79 mmHg (05/22 0624) SpO2:  [95 %-98 %] 95 % (05/22 0624)  Intake/Output from previous day: 05/21 0701 - 05/22 0700 In: 1760 [P.O.:1560; I.V.:200] Out: 2100 [Urine:2100] Intake/Output this shift: Total I/O In: 360 [P.O.:360] Out: 200 [Urine:200]  PE:  L LE ex fix in place.  Pin sites dry.  Medial incision healing well.  No signs of infection.  Skin wrinkles anteriorly and laterally but ankle still somewhat swollen.    Assessment/Plan: 5 Days Post-Op Procedure(s) (LRB): OPEN REDUCTION INTERNAL FIXATION (ORIF) TIBIA/FIBULA FRACTURE (Left)  Pt with fibula and tibia fractures s/p ex fix.  OR planned for tomorrow.  Based on appearance of skin it's safe to proceed with operative treatment.  He understands the risks and benefits of surgical treatment and elects to proceed with ORIF.  Toni Arthurs 06/22/2011, 1:33 PM

## 2011-06-23 ENCOUNTER — Inpatient Hospital Stay (HOSPITAL_COMMUNITY): Payer: Medicaid Other | Admitting: Anesthesiology

## 2011-06-23 ENCOUNTER — Encounter (HOSPITAL_COMMUNITY): Payer: Self-pay | Admitting: Anesthesiology

## 2011-06-23 ENCOUNTER — Inpatient Hospital Stay (HOSPITAL_COMMUNITY): Payer: Medicaid Other

## 2011-06-23 ENCOUNTER — Encounter (HOSPITAL_COMMUNITY)
Admission: EM | Disposition: A | Discharge status: Home / Self Care | Payer: Self-pay | Source: Home / Self Care | Attending: Orthopedic Surgery

## 2011-06-23 HISTORY — PX: ORIF ANKLE FRACTURE: SHX5408

## 2011-06-23 LAB — SURGICAL PCR SCREEN
MRSA, PCR: NEGATIVE
Staphylococcus aureus: NEGATIVE

## 2011-06-23 SURGERY — OPEN REDUCTION INTERNAL FIXATION (ORIF) ANKLE FRACTURE
Anesthesia: General | Site: Ankle | Laterality: Left | Wound class: Clean Contaminated

## 2011-06-23 MED ORDER — PROPOFOL 10 MG/ML IV EMUL
INTRAVENOUS | Status: DC | PRN
  Administered 2011-06-23: 200 mg via INTRAVENOUS

## 2011-06-23 MED ORDER — MEPERIDINE HCL 25 MG/ML IJ SOLN
6.2500 mg | INTRAMUSCULAR | Status: DC | PRN
Start: 2011-06-23 — End: 2011-06-23

## 2011-06-23 MED ORDER — LACTATED RINGERS IV SOLN
INTRAVENOUS | Status: DC | PRN
  Administered 2011-06-23 (×3): via INTRAVENOUS

## 2011-06-23 MED ORDER — METOCLOPRAMIDE HCL 10 MG PO TABS: 5.0000 mg | ORAL_TABLET | Freq: Three times a day (TID) | ORAL | Status: DC | PRN

## 2011-06-23 MED ORDER — GLYCOPYRROLATE 0.2 MG/ML IJ SOLN
INTRAMUSCULAR | Status: DC | PRN
  Administered 2011-06-23: 1 mg via INTRAVENOUS

## 2011-06-23 MED ORDER — HYDROMORPHONE HCL PF 1 MG/ML IJ SOLN
0.2500 mg | INTRAMUSCULAR | Status: DC | PRN
  Administered 2011-06-23 (×2): 0.5 mg via INTRAVENOUS

## 2011-06-23 MED ORDER — ONDANSETRON HCL 4 MG/2ML IJ SOLN
INTRAMUSCULAR | Status: DC | PRN
  Administered 2011-06-23: 4 mg via INTRAVENOUS

## 2011-06-23 MED ORDER — ONDANSETRON HCL 4 MG PO TABS: 4.0000 mg | ORAL_TABLET | Freq: Four times a day (QID) | ORAL | Status: DC | PRN

## 2011-06-23 MED ORDER — SODIUM CHLORIDE 0.9 % IR SOLN
Status: DC | PRN
  Administered 2011-06-23: 3000 mL

## 2011-06-23 MED ORDER — LIDOCAINE HCL (CARDIAC) 20 MG/ML IV SOLN
INTRAVENOUS | Status: DC | PRN
  Administered 2011-06-23: 100 mg via INTRAVENOUS

## 2011-06-23 MED ORDER — BUPIVACAINE-EPINEPHRINE PF 0.5-1:200000 % IJ SOLN
INTRAMUSCULAR | Status: DC | PRN
  Administered 2011-06-23: 30 mL

## 2011-06-23 MED ORDER — BACITRACIN ZINC 500 UNIT/GM EX OINT
TOPICAL_OINTMENT | CUTANEOUS | Status: DC | PRN
  Administered 2011-06-23: 1 via TOPICAL

## 2011-06-23 MED ORDER — METOCLOPRAMIDE HCL 5 MG/ML IJ SOLN: 10.0000 mg | Freq: Once | INTRAMUSCULAR | Status: DC | PRN

## 2011-06-23 MED ORDER — METOCLOPRAMIDE HCL 5 MG/ML IJ SOLN: 5.0000 mg | Freq: Three times a day (TID) | INTRAMUSCULAR | Status: DC | PRN

## 2011-06-23 MED ORDER — EPHEDRINE SULFATE 50 MG/ML IJ SOLN
INTRAMUSCULAR | Status: DC | PRN
  Administered 2011-06-23 (×3): 5 mg via INTRAVENOUS

## 2011-06-23 MED ORDER — MIDAZOLAM HCL 5 MG/5ML IJ SOLN
INTRAMUSCULAR | Status: DC | PRN
  Administered 2011-06-23 (×2): 1 mg via INTRAVENOUS

## 2011-06-23 MED ORDER — NEOSTIGMINE METHYLSULFATE 1 MG/ML IJ SOLN
INTRAMUSCULAR | Status: DC | PRN
  Administered 2011-06-23: 5 mg via INTRAVENOUS

## 2011-06-23 MED ORDER — POTASSIUM CHLORIDE IN NACL 20-0.9 MEQ/L-% IV SOLN
INTRAVENOUS | Status: DC
  Administered 2011-06-23: 19:00:00 via INTRAVENOUS
  Administered 2011-06-24: 100 mL/h via INTRAVENOUS
  Filled 2011-06-23 (×7): qty 1000

## 2011-06-23 MED ORDER — FENTANYL CITRATE 0.05 MG/ML IJ SOLN
INTRAMUSCULAR | Status: DC | PRN
  Administered 2011-06-23: 50 ug via INTRAVENOUS
  Administered 2011-06-23: 100 ug via INTRAVENOUS
  Administered 2011-06-23: 50 ug via INTRAVENOUS
  Administered 2011-06-23: 100 ug via INTRAVENOUS
  Administered 2011-06-23: 50 ug via INTRAVENOUS
  Administered 2011-06-23: 100 ug via INTRAVENOUS
  Administered 2011-06-23: 50 ug via INTRAVENOUS

## 2011-06-23 MED ORDER — DEXTROSE 5 % IV SOLN
INTRAVENOUS | Status: DC | PRN
  Administered 2011-06-23: 12:00:00 via INTRAVENOUS

## 2011-06-23 MED ORDER — ROCURONIUM BROMIDE 100 MG/10ML IV SOLN
INTRAVENOUS | Status: DC | PRN
  Administered 2011-06-23: 10 mg via INTRAVENOUS
  Administered 2011-06-23: 50 mg via INTRAVENOUS
  Administered 2011-06-23: 20 mg via INTRAVENOUS
  Administered 2011-06-23 (×3): 10 mg via INTRAVENOUS

## 2011-06-23 MED ORDER — ONDANSETRON HCL 4 MG/2ML IJ SOLN: 4.0000 mg | Freq: Four times a day (QID) | INTRAMUSCULAR | Status: DC | PRN

## 2011-06-23 SURGICAL SUPPLY — 76 items
BANDAGE ESMARK 6X9 LF (GAUZE/BANDAGES/DRESSINGS) ×1 IMPLANT
BIT DRILL 2.5X2.75 QC CALB (BIT) ×2 IMPLANT
BIT DRILL CALIBRATED 2.7 (BIT) ×2 IMPLANT
BLADE SURG 15 STRL LF DISP TIS (BLADE) ×1 IMPLANT
BLADE SURG 15 STRL SS (BLADE) ×1
BNDG COHESIVE 4X5 TAN STRL (GAUZE/BANDAGES/DRESSINGS) ×2 IMPLANT
BNDG COHESIVE 6X5 TAN STRL LF (GAUZE/BANDAGES/DRESSINGS) ×2 IMPLANT
BNDG ESMARK 6X9 LF (GAUZE/BANDAGES/DRESSINGS) ×2
CHLORAPREP W/TINT 26ML (MISCELLANEOUS) ×2 IMPLANT
CLOTH BEACON ORANGE TIMEOUT ST (SAFETY) ×2 IMPLANT
COVER SURGICAL LIGHT HANDLE (MISCELLANEOUS) ×2 IMPLANT
CUFF TOURNIQUET SINGLE 34IN LL (TOURNIQUET CUFF) ×2 IMPLANT
CUFF TOURNIQUET SINGLE 44IN (TOURNIQUET CUFF) IMPLANT
DRAPE C-ARM 42X72 X-RAY (DRAPES) ×2 IMPLANT
DRAPE C-ARMOR (DRAPES) ×2 IMPLANT
DRAPE INCISE IOBAN 66X45 STRL (DRAPES) ×2 IMPLANT
DRAPE ORTHO SPLIT 77X108 STRL (DRAPES) ×1
DRAPE SURG ORHT 6 SPLT 77X108 (DRAPES) ×1 IMPLANT
DRAPE U-SHAPE 47X51 STRL (DRAPES) ×2 IMPLANT
DRSG ADAPTIC 3X8 NADH LF (GAUZE/BANDAGES/DRESSINGS) ×2 IMPLANT
DRSG PAD ABDOMINAL 8X10 ST (GAUZE/BANDAGES/DRESSINGS) ×6 IMPLANT
ELECT REM PT RETURN 9FT ADLT (ELECTROSURGICAL) ×2
ELECTRODE REM PT RTRN 9FT ADLT (ELECTROSURGICAL) ×1 IMPLANT
GLOVE BIO SURGEON STRL SZ8 (GLOVE) ×2 IMPLANT
GLOVE BIOGEL PI IND STRL 8 (GLOVE) ×1 IMPLANT
GLOVE BIOGEL PI INDICATOR 8 (GLOVE) ×1
GOWN PREVENTION PLUS XLARGE (GOWN DISPOSABLE) IMPLANT
GOWN STRL NON-REIN LRG LVL3 (GOWN DISPOSABLE) IMPLANT
K-WIRE ACE 1.6X6 (WIRE) ×10
KIT BASIN OR (CUSTOM PROCEDURE TRAY) ×2 IMPLANT
KIT ROOM TURNOVER OR (KITS) ×2 IMPLANT
KWIRE ACE 1.6X6 (WIRE) ×5 IMPLANT
MANIFOLD NEPTUNE II (INSTRUMENTS) ×2 IMPLANT
NEEDLE 22X1 1/2 (OR ONLY) (NEEDLE) IMPLANT
NS IRRIG 1000ML POUR BTL (IV SOLUTION) ×2 IMPLANT
PACK ORTHO EXTREMITY (CUSTOM PROCEDURE TRAY) ×2 IMPLANT
PAD ARMBOARD 7.5X6 YLW CONV (MISCELLANEOUS) ×4 IMPLANT
PAD CAST 4YDX4 CTTN HI CHSV (CAST SUPPLIES) ×2 IMPLANT
PADDING CAST COTTON 4X4 STRL (CAST SUPPLIES) ×2
PADDING CAST COTTON 6X4 STRL (CAST SUPPLIES) ×2 IMPLANT
PLATE 9H LT DIST ANTLAT TIB (Plate) ×1 IMPLANT
PLATE ANTLAT CNTR W 157X9 (Plate) ×1 IMPLANT
PLATE LOCK 12H 164 BILAT FIB (Plate) ×2 IMPLANT
SCREW CANC LAG 4X55 (Screw) ×2 IMPLANT
SCREW CORT FT 32X3.5XNONLOCK (Screw) ×2 IMPLANT
SCREW CORTICAL 3.5MM  30MM (Screw) ×3 IMPLANT
SCREW CORTICAL 3.5MM  32MM (Screw) ×2 IMPLANT
SCREW CORTICAL 3.5MM 30MM (Screw) ×3 IMPLANT
SCREW CORTICAL LOW PROF 3.5X20 (Screw) ×2 IMPLANT
SCREW LOCK CORT STAR 3.5X10 (Screw) ×2 IMPLANT
SCREW LOCK CORT STAR 3.5X12 (Screw) ×2 IMPLANT
SCREW LOCK CORT STAR 3.5X32 (Screw) ×2 IMPLANT
SCREW LOCK CORT STAR 3.5X46 (Screw) ×2 IMPLANT
SCREW LOCK CORT STAR 3.5X50 (Screw) ×4 IMPLANT
SCREW LOW PROFILE 18MMX3.5MM (Screw) ×2 IMPLANT
SCREW NON LOCKING LP 3.5 14MM (Screw) ×4 IMPLANT
SCREW NON LOCKING LP 3.5 16MM (Screw) ×2 IMPLANT
SPONGE GAUZE 4X4 12PLY (GAUZE/BANDAGES/DRESSINGS) ×4 IMPLANT
SPONGE LAP 18X18 X RAY DECT (DISPOSABLE) ×2 IMPLANT
STAPLER VISISTAT 35W (STAPLE) IMPLANT
SUCTION FRAZIER TIP 10 FR DISP (SUCTIONS) ×2 IMPLANT
SUT MNCRL AB 3-0 PS2 18 (SUTURE) ×4 IMPLANT
SUT PDS AB 1 CT  36 (SUTURE) ×1
SUT PDS AB 1 CT 36 (SUTURE) ×1 IMPLANT
SUT PROLENE 3 0 PS 2 (SUTURE) ×4 IMPLANT
SUT VIC AB 2-0 CT1 27 (SUTURE) ×2
SUT VIC AB 2-0 CT1 TAPERPNT 27 (SUTURE) ×2 IMPLANT
SUT VIC AB 3-0 PS2 18 (SUTURE) ×1
SUT VIC AB 3-0 PS2 18XBRD (SUTURE) ×1 IMPLANT
SYR CONTROL 10ML LL (SYRINGE) ×2 IMPLANT
TAP CORT 3.5MM SOLID DISP (TRAUMA) ×2 IMPLANT
TOWEL OR 17X24 6PK STRL BLUE (TOWEL DISPOSABLE) ×2 IMPLANT
TOWEL OR 17X26 10 PK STRL BLUE (TOWEL DISPOSABLE) ×2 IMPLANT
TRAY FOLEY CATH 14FR (SET/KITS/TRAYS/PACK) ×2 IMPLANT
TUBE CONNECTING 12X1/4 (SUCTIONS) ×2 IMPLANT
WATER STERILE IRR 1000ML POUR (IV SOLUTION) IMPLANT

## 2011-06-23 NOTE — Transfer of Care (Signed)
Immediate Anesthesia Transfer of Care Note  Patient: Isaac Bryant  Procedure(s) Performed: Procedure(s) (LRB): OPEN REDUCTION INTERNAL FIXATION (ORIF) ANKLE FRACTURE (Left)  Patient Location: PACU  Anesthesia Type: General  Level of Consciousness: awake, alert  and oriented  Airway & Oxygen Therapy: Patient Spontanous Breathing and Patient connected to nasal cannula oxygen  Post-op Assessment: Report given to PACU RN and Post -op Vital signs reviewed and stable  Post vital signs: Reviewed and stable  Complications: No apparent anesthesia complications

## 2011-06-23 NOTE — Progress Notes (Signed)
Utilization review completed. Artur Winningham, RN, BSN. 06/23/11  

## 2011-06-23 NOTE — Brief Op Note (Signed)
06/17/2011 - 06/23/2011  4:27 PM  PATIENT:  Isaac Bryant  31 y.o. male  PRE-OPERATIVE DIAGNOSIS: 1.  open LEFT TIBIAL PILON AND FIBULA FRACTUREs s/p Ex fix  POST-OPERATIVE DIAGNOSIS:  Same  Procedure(s): 1.  Irrigation and debridement of open tibia fracture incl. Skin, subcut, muscle and bone 2.  ORIF of tibial pilon and fibular shaft fractures 3.  Removal of ex fix under anesthesia 4.  Fluoro > 1 hour  SURGEON:  Toni Arthurs, MD  ASSISTANT: n/a  ANESTHESIA:   General, regional  EBL:  minimal   TOURNIQUET:   Total Tourniquet Time Documented: Thigh (Left) - 141 minutes  COMPLICATIONS:  None apparent  DISPOSITION:  Extubated, awake and stable to recovery.  DICTATION ID:   [id number]

## 2011-06-23 NOTE — Anesthesia Procedure Notes (Addendum)
Anesthesia Regional Block:  Popliteal block  Pre-Anesthetic Checklist: ,, timeout performed, Correct Patient, Correct Site, Correct Laterality, Correct Procedure, Correct Position, site marked, Risks and benefits discussed,  Surgical consent,  Pre-op evaluation,  At surgeon's request and post-op pain management  Laterality: Left  Prep: chloraprep       Needles:  Injection technique: Single-shot  Needle Type: Echogenic Stimulator Needle          Additional Needles:  Procedures: ultrasound guided and nerve stimulator Popliteal block  Nerve Stimulator or Paresthesia:  Response: plantar flexion of foot, 0.45 mA,   Additional Responses:   Narrative:  Start time: 06/23/2011 12:10 PM End time: 06/23/2011 12:23 PM Injection made incrementally with aspirations every 5 mL.  Performed by: Personally  Anesthesiologist: Dr Chaney Malling  Additional Notes: Functioning IV was confirmed and monitors were applied.  A 90mm 21ga Arrow echogenic stimulator needle was used. Sterile prep and drape,hand hygiene and sterile gloves were used.  Negative aspiration and negative test dose prior to incremental administration of local anesthetic. The patient tolerated the procedure well.  Ultrasound guidance: relevent anatomy identified, needle position confirmed, local anesthetic spread visualized around nerve(s), vascular puncture avoided.  Image printed for medical record.   Popliteal block Procedure Name: Intubation Date/Time: 06/23/2011 1:04 PM Performed by: Neomia Dear, RANDEL K Pre-anesthesia Checklist: Emergency Drugs available, Patient identified, Timeout performed, Suction available and Patient being monitored Patient Re-evaluated:Patient Re-evaluated prior to inductionOxygen Delivery Method: Circle system utilized Intubation Type: IV induction Ventilation: Mask ventilation without difficulty Laryngoscope Size: Mac and 3 Grade View: Grade I Tube type: Oral Tube size: 8.0 mm Number of attempts:  1 Airway Equipment and Method: Stylet Placement Confirmation: ETT inserted through vocal cords under direct vision,  breath sounds checked- equal and bilateral and positive ETCO2 Secured at: 23 cm Tube secured with: Tape Dental Injury: Teeth and Oropharynx as per pre-operative assessment

## 2011-06-23 NOTE — Interval H&P Note (Signed)
History and Physical Interval Note:  06/23/2011 12:47 PM  Isaac Bryant  has presented today for surgery, with the diagnosis of LEFT TIBIAL PILON and FIBULA OPEN FRACTURE  The various methods of treatment have been discussed with the patient and family. After consideration of risks, benefits and other options for treatment, the patient has consented to  Procedure(s) (LRB):  OPEN REDUCTION INTERNAL FIXATION (ORIF) of tibial pilon and fibula FRACTUREs (Left) as a surgical intervention .  The patients' history has been reviewed, patient examined, no change in status, stable for surgery.  I have reviewed the patients' chart and labs.  Questions were answered to the patient's satisfaction.     Toni Arthurs

## 2011-06-23 NOTE — Anesthesia Postprocedure Evaluation (Signed)
Anesthesia Post Note  Patient: Isaac Bryant  Procedure(s) Performed: Procedure(s) (LRB): OPEN REDUCTION INTERNAL FIXATION (ORIF) ANKLE FRACTURE (Left)  Anesthesia type: general  Patient location: PACU  Post pain: Pain level controlled  Post assessment: Patient's Cardiovascular Status Stable  Last Vitals:  Filed Vitals:   06/23/11 1730  BP:   Pulse:   Temp:   Resp: 12    Post vital signs: Reviewed and stable  Level of consciousness: sedated  Complications: No apparent anesthesia complications

## 2011-06-23 NOTE — Anesthesia Preprocedure Evaluation (Addendum)
Anesthesia Evaluation  Patient identified by MRN, date of birth, ID band Patient awake    Reviewed: Allergy & Precautions, H&P , NPO status , Patient's Chart, lab work & pertinent test results  Airway Mallampati: I TM Distance: >3 FB Neck ROM: Full    Dental No notable dental hx. (+) Teeth Intact   Pulmonary Current Smoker,  breath sounds clear to auscultation  Pulmonary exam normal       Cardiovascular negative cardio ROS  Rhythm:Regular Rate:Normal     Neuro/Psych negative neurological ROS  negative psych ROS   GI/Hepatic negative GI ROS, Neg liver ROS,   Endo/Other  negative endocrine ROS  Renal/GU negative Renal ROS  negative genitourinary   Musculoskeletal   Abdominal   Peds  Hematology negative hematology ROS (+)   Anesthesia Other Findings   Reproductive/Obstetrics                          Anesthesia Physical Anesthesia Plan  ASA: II  Anesthesia Plan: General   Post-op Pain Management:    Induction: Intravenous  Airway Management Planned: Oral ETT  Additional Equipment:   Intra-op Plan:   Post-operative Plan: Extubation in OR  Informed Consent: I have reviewed the patients History and Physical, chart, labs and discussed the procedure including the risks, benefits and alternatives for the proposed anesthesia with the patient or authorized representative who has indicated his/her understanding and acceptance.   Dental advisory given  Plan Discussed with: Anesthesiologist, Surgeon and CRNA  Anesthesia Plan Comments:        Anesthesia Quick Evaluation

## 2011-06-23 NOTE — Preoperative (Signed)
Beta Blockers   Reason not to administer Beta Blockers:Not Applicable 

## 2011-06-23 NOTE — Progress Notes (Signed)
PT Cancel Note:   Pt scheduled to return to OR this AM for ORIF.  Will check back with Pt tomorrow.    Verdell Face, [place] [phone] 06/23/2011

## 2011-06-24 MED ORDER — HYDROMORPHONE HCL PF 1 MG/ML IJ SOLN
2.0000 mg | INTRAMUSCULAR | Status: DC | PRN
  Administered 2011-06-24 (×2): 2 mg via INTRAVENOUS
  Filled 2011-06-24 (×2): qty 2

## 2011-06-24 MED ORDER — CELECOXIB 200 MG PO CAPS
200.0000 mg | ORAL_CAPSULE | Freq: Every day | ORAL | Status: DC
  Administered 2011-06-24 – 2011-06-25 (×2): 200 mg via ORAL
  Filled 2011-06-24 (×2): qty 1

## 2011-06-24 MED ORDER — OXYCODONE HCL 5 MG PO TABS
10.0000 mg | ORAL_TABLET | ORAL | Status: DC | PRN
  Administered 2011-06-24 – 2011-06-25 (×7): 20 mg via ORAL
  Filled 2011-06-24 (×7): qty 4

## 2011-06-24 MED ORDER — MORPHINE SULFATE ER 30 MG PO TBCR
30.0000 mg | EXTENDED_RELEASE_TABLET | Freq: Three times a day (TID) | ORAL | Status: DC
  Administered 2011-06-24 – 2011-06-25 (×4): 30 mg via ORAL
  Filled 2011-06-24 (×5): qty 1

## 2011-06-24 NOTE — Op Note (Signed)
NAME:  Isaac Bryant, LUNDBERG NO.:  [id number]  MEDICAL RECORD NO.:  [id number]  LOCATION:  5033                         FACILITY:  [facility]  PHYSICIAN:  Toni Arthurs, MD        DATE OF BIRTH:  Nov 13, 1980  DATE OF PROCEDURE:  06/23/2011 DATE OF DISCHARGE:                              OPERATIVE REPORT   PREOPERATIVE DIAGNOSIS:  Open left tibial pilon and fibular fractures, status post Irrigation and debridement, and application of an external fixator.  POSTOPERATIVE DIAGNOSIS:  Open left tibial pilon and fibular fractures, status post incision and drainage, and application of an external fixator.  PROCEDURES: 1. Irrigation and debridement of open left tibia fracture including     skin, subcutaneous tissue, muscle, and bone. 2. Open reduction and internal fixation of tibial pilon and fibular     shaft fractures. 3. Removal of external fixator under anesthesia. 4. Intraoperative interpretation of fluoroscopic imaging greater than     1 hour.  SURGEON:  Toni Arthurs, MD  ANESTHESIA:  General, regional.  ESTIMATED BLOOD LOSS:  Minimal.  TOURNIQUET TIME:  2 hours and 21 minute at 225 mmHg.  COMPLICATIONS:  None apparent.  DISPOSITION:  Extubated, awake and stable to recovery.  INDICATIONS FOR PROCEDURE:  The patient is a 31 year old male who sustained an open left tibial pilon and fibular shaft fractures approximately 5 days ago.  He underwent I and D of the wound with closed reduction and application of an external fixator the night of his injury.  He presents now for definitive fixation of these fractures as well as repeat I and D.  He understands the risks and benefits, the alternative treatment options and elects to proceed with surgical treatment.  He specifically understands risks of bleeding, infection, nerve damage, blood clots, need for additional surgery, arthritis, amputation and death.  This is a staged return to the operating room during the  postoperative period for his initial surgery.  PROCEDURE IN DETAIL:  After preoperative consent was obtained and the correct operative site was identified, the patient was brought to the operating room and placed supine on the operating table.  General anesthesia was induced.  Preoperative antibiotics were administered. Surgical time-out was taken.  The left lower extremity was prepped and draped in standard sterile fashion with a tourniquet around the thigh. The patient's previous medial laceration was identified.  The sutures had previously been removed.  The wound was then sequentially debrided from the level of the skin down to the level of the bone including subcutaneous tissue and muscle.  This was done in circumferential fashion, and all necrotic and nonviable tissue was removed.  The wound was then irrigated copiously with 3 liters of normal saline.  The wound was then provisionally closed.  Attention was then turned to the lateral aspect of the leg where the fibular fracture was identified on AP fluoroscopic images.  The extremity was exsanguinated and the tourniquet was inflated to 225 mmHg. A lateral incision was made.  Sharp dissection was carried down through the skin and subcutaneous tissue taking care to protect the superficial peroneal nerve.  The fracture site was identified.  The fracture was provisionally reduced.  The external  fixator was tightened holding this reduction.  A plate was selected and applied to the lateral aspect of the fibula.  It was pinned into position.  AP and lateral fluoroscopic images showed appropriate reduction of the fracture and appropriate position and length of the plate.  The plate was positioned to span the segmental portion of the fracture centrally.  The appropriate length of the fibula was restored using the bone as appropriate landmarks.  This was confirmed by the appearance of the ankle joint itself.  The plate was fixed distally  with two bicortical screws and two unicortical screws.  Holding the reduction, the plate was fixed in proximally with three bicortical screws.  This left a span of four holes positioned over the segmental comminuted portion of the fracture.  AP and lateral views confirmed appropriate reduction and appropriate position and length of all hardware.  Attention was then turned to the anterior aspect of the leg.  A curvilinear incision was made incorporating part of the laceration. This allowed a full-thickness flap to be reflected laterally exposing the fracture site.  Care was taken to maintain the anterior joint capsule and the fracture site was opened.  The fracture was reduced and provisionally pinned.  AP and lateral views showed appropriate reduction of the fracture in the articular surface of the distal tibia.  A broad distal tibial anterior plate was selected from the [employer] set.  This was applied and pinned into position.  AP and lateral views confirmed appropriate position of the plate.  A 4-mm lag screw was inserted through the plate compressing the coronal plane fracture lines.  The remaining three holes in the distal limb of the plate were then drilled and filled with locking screws, and the lag screw was removed and replaced with a locking screw.  The proximal limb of the plate had provisionally been fixed to the tibia using a fixation pin.  The proximal end of the plate was then secured with three bicortical screws and the fixation pin was removed.  AP, mortise and lateral views showed appropriate position and length of all hardware and appropriate reduction of the fracture.  The oblique views showed bicortical position of the proximal screws.  The anterior wound was irrigated copiously. The extensor retinaculum was repaired over the tibialis anterior and EHL tendons with #1 PDS simple sutures.  The subcutaneous tissue was closed with inverted simple sutures of 3-0 Monocryl and  the skin was closed with a running 3-0 Prolene.  The lateral incision was closed in similar fashion.  The external fixator was removed in its entirety.  All of the ex-fix holes were curetted and irrigated copiously.  The tourniquet was released at 2 hours and 21 minutes.  Hemostasis was achieved prior to closure.  Sterile dressings were applied followed by a well-padded short- leg splint.  The patient was then awakened by Anesthesia and transported to the recovery room in stable condition.  FOLLOWUP PLAN:  The patient will be nonweightbearing on the left lower extremity.  He will have physical therapy and likely remain in the hospital for a day or 2 prior to discharge.     Toni Arthurs, MD     JH/MEDQ  D:  06/23/2011  T:  06/24/2011  Job:  [id number]

## 2011-06-24 NOTE — Progress Notes (Signed)
PT Cancel Note:   Pt refusing therapy due to "severe pain".  Spoke with RN who reports pt has received all pain meds available at this time.  Will attempt back later today if time allows.    Isaac Bryant, [place] [phone] 06/24/2011

## 2011-06-24 NOTE — Progress Notes (Signed)
Subjective: 1 Day Post-Op Procedure(s) (LRB): OPEN REDUCTION INTERNAL FIXATION (ORIF) ANKLE FRACTURE (Left) Patient reports pain as severe.  Not controlled with oxycontin, oxycodone and dilaudid IV.  Objective: Vital signs in last 24 hours: Temp:  [97.2 F (36.2 C)-100.2 F (37.9 C)] 99.9 F (37.7 C) (05/24 0504) Pulse Rate:  [80-105] 105  (05/24 0504) Resp:  [11-20] 18  (05/24 0504) BP: (103-155)/(62-82) 123/76 mmHg (05/24 0504) SpO2:  [93 %-100 %] 93 % (05/24 0504)  Intake/Output from previous day: 05/23 0701 - 05/24 0700 In: 3545 [P.O.:520; I.V.:3025] Out: 2525 [Urine:2400; Blood:125] Intake/Output this shift:     Basename 06/22/11 1538  HGB 14.3    Basename 06/22/11 1538  WBC 9.7  RBC 4.61  HCT 41.0  PLT 225    Basename 06/22/11 1538  NA 136  K 4.5  CL 97  CO2 31  BUN 10  CREATININE 0.95  GLUCOSE 97  CALCIUM 9.5   No results found for this basename: LABPT:2,INR:2 in the last 72 hours  L ankle splinted.  Wiggles toes.  Brisk cap refill.  Assessment/Plan: 1 Day Post-Op Procedure(s) (LRB): OPEN REDUCTION INTERNAL FIXATION (ORIF) ANKLE FRACTURE (Left) Up with therapy We'll try MS contin instead and increase his oxycodone dose.  D/c IV meds.  Start celebrex.  HEWITTJonny Ruiz 06/24/2011, 7:30 AM

## 2011-06-24 NOTE — Progress Notes (Signed)
Pt began experiencing increased pain in left ankle as popliteal nerve block wore off. He has received all pain meds and robaxin. Alphonsa Overall, PA notified of pt's increased pain. No new pain meds given. Agreed to allow pt to have dilaudid early for current c/o pain. Toes are warm to touch with no swelling noted.

## 2011-06-25 MED ORDER — DSS 100 MG PO CAPS: 100.0000 mg | ORAL_CAPSULE | Freq: Two times a day (BID) | ORAL | Status: DC

## 2011-06-25 MED ORDER — ASPIRIN BUFFERED 325 MG PO TABS: 325.0000 mg | ORAL_TABLET | Freq: Every day | ORAL | Status: DC

## 2011-06-25 MED ORDER — ACETAMINOPHEN 500 MG PO TABS: 1000.0000 mg | ORAL_TABLET | Freq: Four times a day (QID) | ORAL | Status: DC | PRN

## 2011-06-25 MED ORDER — MORPHINE SULFATE ER 15 MG PO TBCR: EXTENDED_RELEASE_TABLET | ORAL | Status: DC

## 2011-06-25 MED ORDER — OXYCODONE HCL 10 MG PO TABS: 10.0000 mg | ORAL_TABLET | ORAL | Status: AC | PRN

## 2011-06-25 MED ORDER — SENNA 8.6 MG PO TABS: 2.0000 | ORAL_TABLET | Freq: Two times a day (BID) | ORAL | Status: DC

## 2011-06-25 NOTE — Discharge Summary (Signed)
Physician Discharge Summary  Patient ID: Isaac Bryant MRN: [id number] DOB/AGE: 10-05-1980 30 y.o.  Admit date: 06/17/2011 Discharge date: 06/25/2011  Admission Diagnoses:  Left tibial pilon and fibula fractures  Discharge Diagnoses:  Left tibial pilon and fibula fractures s/p ORIF  Discharged Condition: stable  Hospital Course: Pt was admitted and underwent ex fix and I and D of his open ankle wound.  He was taken back to the OR several days later for removal of the ex fix and ORIF ot he wound.  Post op pain control was challenging requiring ms contin and oxycodone.  He did well with PT.  Consults: None  Significant Diagnostic Studies: none  Treatments: surgery: ORIF of open left tibial pilon and fibula fractures.  Discharge Exam: Blood pressure 142/79, pulse 103, temperature 98.9 F (37.2 C), temperature source Oral, resp. rate 18, height 5\' 9"  (1.753 m), weight 83.915 kg (185 lb), SpO2 100.00%. splint c / d / i.  NVI L LE.  Disposition: 01-Home or Self Care  Discharge Orders    Future Orders Please Complete By Expires   Diet - low sodium heart healthy      Call MD / Call 911      Comments:   If you experience chest pain or shortness of breath, CALL 911 and be transported to the hospital emergency room.  If you develope a fever above 101 F, pus (white drainage) or increased drainage or redness at the wound, or calf pain, call your surgeon's office.   Constipation Prevention      Comments:   Drink plenty of fluids.  Prune juice may be helpful.  You may use a stool softener, such as Colace (over the counter) 100 mg twice a day.  Use MiraLax (over the counter) for constipation as needed.   Increase activity slowly as tolerated        Medication List  As of 06/25/2011 10:48 AM   STOP taking these medications         diazepam 5 MG tablet         TAKE these medications         acetaminophen 500 MG tablet   Commonly known as: TYLENOL   Take 2 tablets (1,000 mg total)  by mouth every 6 (six) hours as needed. For pain      aspirin 325 MG buffered tablet   Take 1 tablet (325 mg total) by mouth daily.      DSS 100 MG Caps   Take 100 mg by mouth 2 (two) times daily.      morphine 15 MG 12 hr tablet   Commonly known as: MS CONTIN   Take two pills every 8 hours for three days and then take one pill every 8 hours for 3 days.      Oxycodone HCl 10 MG Tabs   Take 1-2 tablets (10-20 mg total) by mouth every 4 (four) hours as needed for pain.      senna 8.6 MG Tabs   Commonly known as: SENOKOT   Take 2 tablets (17.2 mg total) by mouth 2 (two) times daily.           Follow-up Information    Follow up with Toni Arthurs, MD in 2 weeks.   Contact information:   [address], [address] [place] [address] [phone]          Signed: Toni Arthurs 06/25/2011, 10:48 AM

## 2011-06-25 NOTE — Progress Notes (Signed)
Subjective: 2 Days Post-Op Procedure(s) (LRB): OPEN REDUCTION INTERNAL FIXATION (ORIF) ANKLE FRACTURE (Left) Patient reports pain as mild.   Pain much better controlled after increasing ms contin.  Objective: Vital signs in last 24 hours: Temp:  [98.4 F (36.9 C)-98.9 F (37.2 C)] 98.9 F (37.2 C) (05/25 0510) Pulse Rate:  [84-103] 103  (05/25 0510) Resp:  [16-18] 18  (05/25 0510) BP: (129-142)/(59-79) 142/79 mmHg (05/25 0510) SpO2:  [96 %-100 %] 100 % (05/25 0510)  Intake/Output from previous day:   Intake/Output this shift: Total I/O In: 240 [P.O.:240] Out: 150 [Urine:150]   Basename 06/22/11 1538  HGB 14.3    Basename 06/22/11 1538  WBC 9.7  RBC 4.61  HCT 41.0  PLT 225    Basename 06/22/11 1538  NA 136  K 4.5  CL 97  CO2 31  BUN 10  CREATININE 0.95  GLUCOSE 97  CALCIUM 9.5   No results found for this basename: LABPT:2,INR:2 in the last 72 hours  Neurologically intact L LE splint intact  Assessment/Plan: 2 Days Post-Op Procedure(s) (LRB): OPEN REDUCTION INTERNAL FIXATION (ORIF) ANKLE FRACTURE (Left) Up with therapy  D/c home today.  Toni Arthurs 06/25/2011, 10:38 AM

## 2011-06-25 NOTE — Progress Notes (Signed)
Discharge instructions and prescriptions given to patient.  Patient instructed to follow-up with Dr. Victorino Dike in two weeks. He verbalized understanding.  All belongings taken with patient.  Patient was instructed to call Dr. Victorino Dike should any issues arise prior to his appointment.  [place] Marney N

## 2011-06-25 NOTE — Progress Notes (Signed)
Physical Therapy Treatment Patient Details Name: Isaac Bryant MRN: [id number] DOB: 13-Jan-1981 Today's Date: 06/25/2011 Time: 4098-1191 PT Time Calculation (min): 22 min  PT Assessment / Plan / Recommendation Comments on Treatment Session  Pt admitted s/p left ankle fracture and continues to progress.  Pt able to increase ambulation distance/independence this am as well as tolerate stair negotiation.  Pt ready for safe d/c home once medically cleared by MD.    Follow Up Recommendations  Home health PT    Barriers to Discharge        Equipment Recommendations  Wheelchair (measurements);Rolling walker with 5" wheels;Hospital bed;3 in 1 bedside comode    Recommendations for Other Services    Frequency 7X/week   Plan Discharge plan remains appropriate;Frequency remains appropriate    Precautions / Restrictions Restrictions Weight Bearing Restrictions: Yes LLE Weight Bearing: Non weight bearing   Pertinent Vitals/Pain 8/10 in left LE.  Pt repositioned and premedicated.    Mobility  Bed Mobility Bed Mobility: Supine to Sit;Sit to Supine Supine to Sit: 6: Modified independent (Device/Increase time) Sit to Supine: 6: Modified independent (Device/Increase time) Transfers Transfers: Sit to Stand;Stand to Sit (3 trials.) Sit to Stand: 6: Modified independent (Device/Increase time);With upper extremity assist (From multiple surfaces.) Stand to Sit: 6: Modified independent (Device/Increase time);With upper extremity assist (To multiple surfaces.) Ambulation/Gait Ambulation/Gait Assistance: 5: Supervision Ambulation Distance (Feet): 150 Feet Assistive device: Rolling walker Ambulation/Gait Assistance Details: Verbal cues for safety inside RW with tall posture. Gait Pattern: Step-to pattern Stairs: Yes Stairs Assistance: 4: Min assist Stairs Assistance Details (indicate cue type and reason): Assist for balance and to steady RW with cues for sequence "up with good, down with  bad." Stair Management Technique: Step to pattern;Backwards;With walker Number of Stairs: 2  Wheelchair Mobility Wheelchair Mobility: Yes Wheelchair Assistance: 6: Modified independent (Device/Increase time) Occupational hygienist: Both upper extremities Wheelchair Parts Management: Supervision/cueing Distance: 150    Exercises     PT Diagnosis:    PT Problem List:   PT Treatment Interventions:     PT Goals Acute Rehab PT Goals PT Goal Formulation: With patient/family Time For Goal Achievement: 06/26/11 Potential to Achieve Goals: Good PT Goal: Supine/Side to Sit - Progress: Progressing toward goal PT Goal: Sit to Stand - Progress: Met Pt will Ambulate: >150 feet;with modified independence;with least restrictive assistive device PT Goal: Ambulate - Progress: Updated due to goal met PT Goal: Propel Wheelchair - Progress: Met  Visit Information  Last PT Received On: 06/25/11 Assistance Needed: +1    Subjective Data  Subjective: "I am ready to go home." Patient Stated Goal: be able to walk   Cognition  Overall Cognitive Status: Appears within functional limits for tasks assessed/performed Arousal/Alertness: Awake/alert Orientation Level: Appears intact for tasks assessed Behavior During Session: [facility] for tasks performed    Balance  Balance Balance Assessed: No  End of Session PT - End of Session Equipment Utilized During Treatment: Gait belt Activity Tolerance: Patient tolerated treatment well Patient left: in bed;with call bell/phone within reach;with family/visitor present Nurse Communication: Mobility status    Cephus Shelling 06/25/2011, 9:55 AM  06/25/2011 Cephus Shelling, PT, DPT [phone]

## 2011-06-25 NOTE — Discharge Instructions (Signed)
Toni Arthurs, MD [facility]  Please read the following information regarding your care after surgery.  Medications  You only need a prescription for the narcotic pain medicine (ex. oxycodone, Percocet, Norco).  All of the other medicines listed below are available over the counter. X acetominophen (Tylenol) 1000 mg every 6 hours as you need for minor pain X oxycodone as prescribed for moderate to severe pain X MS Contin as prescribed for 6 days   Narcotic pain medicine (ex. oxycodone, Percocet, Vicodin) will cause constipation.  To prevent this problem, take the following medicines while you are taking any pain medicine. X docusate sodium (Colace) 100 mg twice a day X senna (Senokot) 2 tablets twice a day  X To help prevent blood clots, take an aspirin (325 mg) once a day for a month after surgery.  You should also get up every hour while you are awake to move around.    Weight Bearing ? Bear weight when you are able on your operated leg or foot. ? Bear weight only on the heel of your operated foot in the post-op shoe. X Do not bear any weight on the operated leg or foot.  Cast / Splint / Dressing X Keep your splint or cast clean and dry.  Don't put anything (coat hanger, pencil, etc) down inside of it.  If it gets damp, use a hair dryer on the cool setting to dry it.  If it gets soaked, call the office to schedule an appointment for a cast change. ? Remove your dressing 3 days after surgery and cover the incisions with dry dressings.    After your dressing, cast or splint is removed; you may shower, but do not soak or scrub the wound.  Allow the water to run over it, and then gently pat it dry.  Swelling It is normal for you to have swelling where you had surgery.  To reduce swelling and pain, keep your toes above your nose for at least 3 days after surgery.  It may be necessary to keep your foot or leg elevated for several weeks.  If it hurts, it should be  elevated.  Follow Up Call my office at [phone] when you are discharged from the hospital or surgery center to schedule an appointment to be seen two weeks after surgery.  Call my office at [phone] if you develop a fever >101.5 F, nausea, vomiting, bleeding from the surgical site or severe pain.

## 2011-06-28 ENCOUNTER — Encounter (HOSPITAL_COMMUNITY): Payer: Self-pay | Admitting: Orthopedic Surgery

## 2011-06-30 ENCOUNTER — Emergency Department (HOSPITAL_COMMUNITY): Payer: Medicaid Other

## 2011-06-30 ENCOUNTER — Encounter (HOSPITAL_COMMUNITY): Payer: Self-pay | Admitting: Emergency Medicine

## 2011-06-30 ENCOUNTER — Emergency Department (HOSPITAL_COMMUNITY)
Admission: EM | Admit: 2011-06-30 | Discharge: 2011-06-30 | Disposition: A | Discharge status: Home / Self Care | Payer: Medicaid Other | Attending: Emergency Medicine | Admitting: Emergency Medicine

## 2011-06-30 DIAGNOSIS — X58XXXA Exposure to other specified factors, initial encounter: Secondary | ICD-10-CM | POA: Insufficient documentation

## 2011-06-30 DIAGNOSIS — M79609 Pain in unspecified limb: Secondary | ICD-10-CM | POA: Insufficient documentation

## 2011-06-30 DIAGNOSIS — F17200 Nicotine dependence, unspecified, uncomplicated: Secondary | ICD-10-CM | POA: Insufficient documentation

## 2011-06-30 DIAGNOSIS — M79605 Pain in left leg: Secondary | ICD-10-CM

## 2011-06-30 DIAGNOSIS — Z9889 Other specified postprocedural states: Secondary | ICD-10-CM | POA: Insufficient documentation

## 2011-06-30 MED ORDER — HYDROCODONE-ACETAMINOPHEN 7.5-500 MG PO TABS: 1.0000 | ORAL_TABLET | Freq: Four times a day (QID) | ORAL | Status: AC | PRN

## 2011-06-30 NOTE — ED Provider Notes (Signed)
History     CSN: [id number]  Arrival date & time 06/30/11  6578   First MD Initiated Contact with Patient 06/30/11 2018      8:50 PM HPI Patient reports surgery having surgery 7 days ago for a distal tibia/fibula fracture. Reports he had been healing normally than yesterday accidentally stepped onto his left leg incorrectly and since then has had persistent pain. Reports tingling sensation in toes. Also reports that he was recently decreased from Percocet to Vicodin 2 days ago. Reports they are not helping his pain.  Patient is a 31 y.o. male presenting with leg pain. The history is provided by the patient.  Leg Pain  The incident occurred at home. The pain is present in the left leg and left ankle. The quality of the pain is described as throbbing. The pain is severe. The pain has been constant since onset. Associated symptoms include inability to bear weight and tingling. Pertinent negatives include no numbness and no loss of sensation. He reports no foreign bodies present. The symptoms are aggravated by bearing weight. He has tried rest for the symptoms. The treatment provided no relief.    History reviewed. No pertinent past medical history.  Past Surgical History  Procedure Date  . Hernia repair   . Orif ankle fracture 06/23/2011    Procedure: OPEN REDUCTION INTERNAL FIXATION (ORIF) ANKLE FRACTURE;  Surgeon: Toni Arthurs, MD;  Location: [facility] OR;  Service: Orthopedics;  Laterality: Left;  removal of external fixator / orif pilon and fibula    History reviewed. No pertinent family history.  History  Substance Use Topics  . Smoking status: Current Everyday Smoker -- 0.5 packs/day    Types: Cigarettes  . Smokeless tobacco: Never Used  . Alcohol Use: Yes     weekends      Review of Systems  Constitutional: Negative for fever and chills.  Musculoskeletal:       Leg/ankle pain  Neurological: Positive for tingling. Negative for weakness and numbness.  All other systems reviewed  and are negative.    Allergies  Review of patient's allergies indicates no known allergies.  Home Medications   Current Outpatient Rx  Name Route Sig Dispense Refill  . ACETAMINOPHEN 500 MG PO TABS Oral Take 2 tablets (1,000 mg total) by mouth every 6 (six) hours as needed. For pain    . ASPIRIN EC 325 MG PO TBEC Oral Take 325 mg by mouth daily.    Marland Kitchen HYDROCODONE-ACETAMINOPHEN 5-325 MG PO TABS Oral Take 1 tablet by mouth every 6 (six) hours as needed. For pain    . MORPHINE SULFATE ER 15 MG PO TBCR  Take two pills every 8 hours for three days and then take one pill every 8 hours for 3 days. 27 tablet 0  . NAPROXEN SODIUM 220 MG PO TABS Oral Take 440 mg by mouth 2 (two) times daily as needed.    . OXYCODONE HCL 10 MG PO TABS Oral Take 1-2 tablets (10-20 mg total) by mouth every 4 (four) hours as needed for pain. 50 tablet 0    BP 147/84  Pulse 86  Temp(Src) 97.8 F (36.6 C) (Oral)  Resp 16  SpO2 99%  Physical Exam  Constitutional: He is oriented to person, place, and time. He appears well-developed and well-nourished.  HENT:  Head: Normocephalic and atraumatic.  Eyes: Pupils are equal, round, and reactive to light.  Musculoskeletal:       Patient has a temporary short posterior/sugar tong splint on left  leg. Normal capillary refill. Patient reports numbness and tingling sensation of toes. But able to wiggle toes without difficulty  Neurological: He is alert and oriented to person, place, and time.  Skin: Skin is warm and dry. No rash noted. No erythema. No pallor.  Psychiatric: He has a normal mood and affect. His behavior is normal.    ED Course  Procedures   Dg Tibia/fibula Left  06/30/2011  *RADIOLOGY REPORT*  Clinical Data: Recent ORIF.  LEFT TIBIA AND FIBULA - 2 VIEW  Comparison: Preoperative films 06/17/2011.  Intraoperative films 06/23/2011.  Findings: Patient is status post open reduction internal fixation of complex left tibia and fibula fractures.  There are no  visible adverse features.  Images were obtained through plaster cast. Little change from 05/23.  IMPRESSION: As above.  Original Report Authenticated By: Elsie Stain, M.D.     MDM    Will prescribe patient 7.5 Vicodin but advised that he call Dr. Laverta [place] office tomorrow and explain the situation patient should receive close followup for further pain management. Patient voices understanding and is ready for discharge     Thomasene Lot, Cordelia Poche 06/30/11 2123

## 2011-06-30 NOTE — ED Notes (Signed)
Patient discharge via ambulatory with crutches with steady gait. Respirations equal and unlabored. Skin warm and dry. No acute distress noted.

## 2011-06-30 NOTE — ED Notes (Signed)
Pt presented to the ER with c/o left leg pain, pt had recent injury and surgery with bolts and plates placed, pt further explains that he misstep last night while walking and pain increased. Pt also states it feels "like bone is sticking out"

## 2011-06-30 NOTE — ED Notes (Signed)
Patient transported to X-ray 

## 2011-06-30 NOTE — Discharge Instructions (Signed)
Ankle x-ray show no change. Please take medication only as prescribed. It is dangerous to take medication otherwise. Please followup with Dr. Victorino Dike for further pain control if needed.

## 2011-07-07 NOTE — ED Provider Notes (Signed)
Medical screening examination/treatment/procedure(s) were performed by non-physician practitioner and as supervising physician I was immediately available for consultation/collaboration.  Raeford Razor, MD 07/07/11 [phone]

## 2011-08-25 ENCOUNTER — Emergency Department (HOSPITAL_COMMUNITY)
Admission: EM | Admit: 2011-08-25 | Discharge: 2011-08-25 | Disposition: A | Discharge status: Home / Self Care | Payer: Medicaid Other | Attending: Emergency Medicine | Admitting: Emergency Medicine

## 2011-08-25 ENCOUNTER — Encounter (HOSPITAL_COMMUNITY): Payer: Self-pay | Admitting: Emergency Medicine

## 2011-08-25 DIAGNOSIS — M25572 Pain in left ankle and joints of left foot: Secondary | ICD-10-CM

## 2011-08-25 DIAGNOSIS — F17200 Nicotine dependence, unspecified, uncomplicated: Secondary | ICD-10-CM | POA: Insufficient documentation

## 2011-08-25 DIAGNOSIS — M79609 Pain in unspecified limb: Secondary | ICD-10-CM | POA: Insufficient documentation

## 2011-08-25 MED ORDER — HYDROCODONE-ACETAMINOPHEN 5-325 MG PO TABS: 2.0000 | ORAL_TABLET | ORAL | Status: AC | PRN

## 2011-08-25 NOTE — ED Notes (Signed)
Pt st's heel of cast has worn till his heel is exposed.  Also st's he slid on his crutches last pm and put wt on his foot.  St's now is more painful

## 2011-08-25 NOTE — ED Provider Notes (Signed)
History  This chart was scribed for Doug Sou, MD by Ladona Ridgel Day. This patient was seen in room TR09C/TR09C and the patient's care was started at 1609.   CSN: [id number]  Arrival date & time 08/25/11  1609   None     Chief Complaint  Patient presents with  . Cast Problem    The history is provided by the patient. No language interpreter was used.   Isaac Bryant is a 31 y.o. male who presents to the Emergency Department complaining of LLE cast problem that was applied by Dr. Josem Kaufmann. He states that last PM he slipped with his crutches and "stompped" on his left leg cast extending from his left foot to below his left knee. He states the cast is wearing away on the bottom of his left heel and its painful and states that he cant sleep with the pain/requesting pain medication. He states 8/10 pain to his LLE. He denies any allergies.   History reviewed. No pertinent past medical history.  Past Surgical History  Procedure Date  . Hernia repair   . Orif ankle fracture 06/23/2011    Procedure: OPEN REDUCTION INTERNAL FIXATION (ORIF) ANKLE FRACTURE;  Surgeon: Toni Arthurs, MD;  Location: [facility] OR;  Service: Orthopedics;  Laterality: Left;  removal of external fixator / orif pilon and fibula  . Fracture surgery     No family history on file.  History  Substance Use Topics  . Smoking status: Current Everyday Smoker -- 0.5 packs/day    Types: Cigarettes  . Smokeless tobacco: Never Used  . Alcohol Use: Yes     weekends      Review of Systems  Constitutional: Negative.   HENT: Negative.   Respiratory: Negative.   Cardiovascular: Negative.   Gastrointestinal: Negative.   Musculoskeletal: Negative.        Pain at left heel  Skin: Negative.   Neurological: Negative.   Hematological: Negative.   Psychiatric/Behavioral: Negative.     Allergies  Review of patient's allergies indicates no known allergies.  Home Medications   Current Outpatient Rx  Name Route Sig Dispense  Refill  . NAPROXEN SODIUM 220 MG PO TABS Oral Take 220 mg by mouth 2 (two) times daily as needed. For pain      Triage Vitals: BP 148/74  Pulse 76  Temp 98.2 F (36.8 C) (Oral)  Resp 14  SpO2 97%  Physical Exam  Nursing note and vitals reviewed. Constitutional: He appears well-developed and well-nourished. No distress.  HENT:  Head: Normocephalic and atraumatic.  Eyes: Conjunctivae are normal. Pupils are equal, round, and reactive to light.  Neck: Neck supple. No tracheal deviation present. No thyromegaly present.  Cardiovascular: Normal rate and regular rhythm.   No murmur heard. Pulmonary/Chest: Effort normal and breath sounds normal.  Abdominal: Soft. Bowel sounds are normal. He exhibits no distension. There is no tenderness.  Musculoskeletal: Normal range of motion. He exhibits tenderness. He exhibits no edema.       Left lower extremity in short leg cast toes with good capillary refill. Shortly cast is worn away if heel with mild tenderness at heel  Neurological: He is alert. Coordination normal.  Skin: Skin is warm and dry. No rash noted.  Psychiatric: He has a normal mood and affect.    ED Course  Procedures (including critical care time) DIAGNOSTIC STUDIES: Oxygen Saturation is 97% on room air, adequate by my interpretation.    COORDINATION OF CARE: At 657 PM Discussed treatment plan with patient  which includes pain medicine. Patient agrees.   Labs Reviewed - No data to display No results found.   No diagnosis found.    MDM  Patient does not wish to have his cast removed or further or to be evaluated further he wishes only treat prescription for pain medicine he will followup with Dr. Victorino Dike tomorrow   Diagnosis pain in left lower extremity  I personally performed the services described in this documentation, which was scribed in my presence. The recorded information has been reviewed and considered.     Doug Sou, MD 08/25/11 1901

## 2011-08-25 NOTE — ED Notes (Signed)
Patient states "reinjured the left leg last pm on wet floor"  Arrived with cast intact, sensation to foot good, cap refill <3 seconds.

## 2011-08-25 NOTE — ED Notes (Signed)
Discharged home with written and verbal instructions.  No questions or concerns at discharge. 

## 2011-10-17 ENCOUNTER — Encounter (HOSPITAL_COMMUNITY): Payer: Self-pay

## 2011-10-17 ENCOUNTER — Emergency Department (HOSPITAL_COMMUNITY): Payer: Medicaid Other

## 2011-10-17 ENCOUNTER — Emergency Department (HOSPITAL_COMMUNITY)
Admission: EM | Admit: 2011-10-17 | Discharge: 2011-10-17 | Disposition: A | Discharge status: Home / Self Care | Payer: Medicaid Other | Attending: Emergency Medicine | Admitting: Emergency Medicine

## 2011-10-17 DIAGNOSIS — M79609 Pain in unspecified limb: Secondary | ICD-10-CM | POA: Insufficient documentation

## 2011-10-17 DIAGNOSIS — M79606 Pain in leg, unspecified: Secondary | ICD-10-CM

## 2011-10-17 DIAGNOSIS — Z87891 Personal history of nicotine dependence: Secondary | ICD-10-CM | POA: Insufficient documentation

## 2011-10-17 NOTE — ED Notes (Signed)
Patient reports that he took his own cast off of the left foot/ankle 3 days ago and is now having extreme pain. Patient is currently wearing an orthopedic boot.

## 2011-10-17 NOTE — ED Provider Notes (Signed)
History     CSN: [id number]  Arrival date & time 10/17/11  4098   First MD Initiated Contact with Patient 10/17/11 1058      Chief Complaint  Patient presents with  . Ankle Pain  . Leg Pain    (Consider location/radiation/quality/duration/timing/severity/associated sxs/prior treatment) Patient is a 31 y.o. male presenting with ankle pain and leg pain. The history is provided by the patient and medical records.  Ankle Pain  Pertinent negatives include no numbness.  Leg Pain  Pertinent negatives include no numbness.   Isaac Bryant is a 31 y.o. male presents to the emergency department complaining of leg pain.  The onset of the symptoms was  gradual starting 2 days ago.  The patient has associated aching in the leg.  The symptoms have been  persistent, gradually worsened.  nothing makes the symptoms worse and aleve makes symptoms better for a short time  The patient denies fever, chills, swelling in the extremity, numbness, tingling, open wound.  Pt was supposed to follow-up with Dr Victorino Dike on 10/10/11, but the office called and said that he could not be seen by Dr Victorino Dike until he was seen by his PCP.  He has a PCP [employer] Medical) appointment on 10/26/11.  He took his own cast off Saturday (2 days ago) with tin snips.  His foot began to hurt worse after he removed it.  Pt is now in a Cam walker from Dr Victorino Dike. He states steadily increasing pain in the extremity since the cast has been removed.      History reviewed. No pertinent past medical history.  Past Surgical History  Procedure Date  . Hernia repair   . Orif ankle fracture 06/23/2011    Procedure: OPEN REDUCTION INTERNAL FIXATION (ORIF) ANKLE FRACTURE;  Surgeon: Toni Arthurs, MD;  Location: [facility] OR;  Service: Orthopedics;  Laterality: Left;  removal of external fixator / orif pilon and fibula  . Fracture surgery     Family History  Problem Relation Age of Onset  . Diabetes Father     History  Substance Use Topics  . Smoking  status: Former Smoker -- 0.5 packs/day  . Smokeless tobacco: Never Used  . Alcohol Use: Yes     weekends      Review of Systems  Musculoskeletal: Positive for gait problem. Arthralgias: leg pain.  Skin: Negative for color change, rash and wound.  Neurological: Negative for numbness.    Allergies  Review of patient's allergies indicates no known allergies.  Home Medications   Current Outpatient Rx  Name Route Sig Dispense Refill  . NAPROXEN SODIUM 220 MG PO TABS Oral Take 220 mg by mouth 2 (two) times daily as needed. For pain      BP 124/76  Pulse 75  Temp 98.3 F (36.8 C) (Oral)  Resp 16  SpO2 98%  Physical Exam  Nursing note and vitals reviewed. Constitutional: He appears well-developed and well-nourished. No distress.  HENT:  Head: Normocephalic and atraumatic.  Eyes: Conjunctivae normal are normal.  Cardiovascular: Normal rate, regular rhythm, normal heart sounds and intact distal pulses.  Exam reveals no gallop and no friction rub.   No murmur heard.      Capillary refill < 3 sec  Pulmonary/Chest: Effort normal and breath sounds normal. No respiratory distress. He has no wheezes.  Musculoskeletal: He exhibits tenderness. He exhibits no edema.       ROM: decreased ROM of the R ankle  Neurological: He is alert. Coordination normal.  Sensation intact Strength normal in the toes, decreased in the ankle 2/2 pain and decreased ROM  Skin: Skin is warm and dry. He is not diaphoretic.    ED Course  Procedures (including critical care time)  Labs Reviewed - No data to display Dg Ankle Complete Left  10/17/2011  *RADIOLOGY REPORT*  Clinical Data: Pain, fracture history.  LEFT ANKLE COMPLETE - 3+ VIEW  Comparison: 06/30/2011  Findings: Plate and screw fixation of distal tibial and fibular fractures.  The fracture lines remain evident with increased surrounding lucency and mild increased angulation.  The hardware appears intact.  Partially imaged tarsal bones appear  osteopenic and incompletely evaluated, requiring dedicated views of the foot if there is clinical concern in this area. No appreciable interval ankle mortise widening.  IMPRESSION: Interval increased lucency about the fractures within the distal tibia and fibula, may reflect incomplete healing, infection, or re- injury in the appropriate clinical setting.  Hardware appears intact.   Original Report Authenticated By: Waneta Martins, M.D.      1. Leg pain       MDM  Francesco Runner presents for pain in the L lower leg/ankle.  Dr. Victorino Dike states that the patient says he has no showed for more than 2 appointments. They have worked with sales rep stick and a bone stimulator to help his like he'll. He has no showed for this appointment as well. Victorino Dike states that he shows up in his office and demanded pain medicine. He becomes angry when Dr. Victorino Dike will not write the pain medicine. He states that the patient absolutely must stop walking on his leg. He will not heal if he continues to do so. He also states the patient needs to stop smoking and make his appointments.  He is asked that I not give the patient pain medication today. I discussed all these things with the patient.  I have also discussed reasons to return immediately to the ER.  Patient expresses understanding but does not agree with the plan.   1. Medications: Usual home medications 2. Treatment: Do not walk on her foot, take Tylenol for pain, go to followup appointment with Dr. Victorino Dike 3. Follow Up: Dr. Terrence Dupont Kajuan Guyton, PA-C 10/17/11 1243

## 2011-10-17 NOTE — ED Provider Notes (Signed)
Medical screening examination/treatment/procedure(s) were conducted as a shared visit with non-physician practitioner(s) and myself.  I personally evaluated the patient during the encounter  Chronic appearing swelling of the left lower leg, secondary to recent immobilization, with decreased range of motion. No evidence for infection. Joint instability, or DVT.    Flint Melter, MD 10/17/11 [phone]

## 2012-06-06 ENCOUNTER — Emergency Department (HOSPITAL_COMMUNITY)
Admission: EM | Admit: 2012-06-06 | Discharge: 2012-06-06 | Disposition: A | Discharge status: Home / Self Care | Payer: Medicaid Other | Attending: Emergency Medicine | Admitting: Emergency Medicine

## 2012-06-06 ENCOUNTER — Encounter (HOSPITAL_COMMUNITY): Payer: Self-pay | Admitting: Emergency Medicine

## 2012-06-06 DIAGNOSIS — Z87891 Personal history of nicotine dependence: Secondary | ICD-10-CM | POA: Insufficient documentation

## 2012-06-06 DIAGNOSIS — K0889 Other specified disorders of teeth and supporting structures: Secondary | ICD-10-CM

## 2012-06-06 DIAGNOSIS — K089 Disorder of teeth and supporting structures, unspecified: Secondary | ICD-10-CM | POA: Insufficient documentation

## 2012-06-06 MED ORDER — HYDROCODONE-ACETAMINOPHEN 5-325 MG PO TABS: 2.0000 | ORAL_TABLET | Freq: Four times a day (QID) | ORAL | Status: DC | PRN

## 2012-06-06 MED ORDER — PENICILLIN V POTASSIUM 500 MG PO TABS: 500.0000 mg | ORAL_TABLET | Freq: Four times a day (QID) | ORAL | Status: AC

## 2012-06-06 NOTE — ED Provider Notes (Signed)
History     CSN: [id number]  Arrival date & time 06/06/12  1005   First MD Initiated Contact with Patient 06/06/12 1022      Chief Complaint  Patient presents with  . Dental Pain    (Consider location/radiation/quality/duration/timing/severity/associated sxs/prior treatment) HPI Comments: Patient reports that he has pain in his upper tooth for the past couple of years intermittently.   However, he states that the pain has been worse over the past week.  He has an appointment scheduled with his dentist next week.  He has taken Ibuprofen for the pain without relief.  Patient is a 32 y.o. male presenting with tooth pain. The history is provided by the patient.  Dental PainThe primary symptoms include mouth pain. Primary symptoms do not include dental injury, oral lesions or fever.  Additional symptoms include: dental sensitivity to temperature, gum swelling and gum tenderness. Additional symptoms do not include: purulent gums, trismus, facial swelling, trouble swallowing and pain with swallowing.    History reviewed. No pertinent past medical history.  Past Surgical History  Procedure Laterality Date  . Hernia repair    . Orif ankle fracture  06/23/2011    Procedure: OPEN REDUCTION INTERNAL FIXATION (ORIF) ANKLE FRACTURE;  Surgeon: Toni Arthurs, MD;  Location: [facility] OR;  Service: Orthopedics;  Laterality: Left;  removal of external fixator / orif pilon and fibula  . Fracture surgery      Family History  Problem Relation Age of Onset  . Diabetes Father     History  Substance Use Topics  . Smoking status: Former Smoker -- 0.50 packs/day  . Smokeless tobacco: Never Used  . Alcohol Use: Yes     Comment: weekends      Review of Systems  Constitutional: Negative for fever and chills.  HENT: Positive for dental problem. Negative for facial swelling, trouble swallowing, neck pain and neck stiffness.     Allergies  Review of patient's allergies indicates no known allergies.  Home  Medications   Current Outpatient Rx  Name  Route  Sig  Dispense  Refill  . naproxen sodium (ANAPROX) 220 MG tablet   Oral   Take 220 mg by mouth 2 (two) times daily as needed. For pain           BP 134/89  Pulse 63  Temp(Src) 97.3 F (36.3 C) (Oral)  Resp 18  SpO2 98%  Physical Exam  Nursing note and vitals reviewed. Constitutional: He is oriented to person, place, and time. He appears well-developed and well-nourished. No distress.  HENT:  Head: Normocephalic and atraumatic. No trismus in the jaw.  Mouth/Throat: Uvula is midline, oropharynx is clear and moist and mucous membranes are normal. Abnormal dentition. No dental abscesses or edematous. No oropharyngeal exudate, posterior oropharyngeal edema, posterior oropharyngeal erythema or tonsillar abscesses.  Poor dental hygiene. Pt able to open and close mouth with out difficulty. Airway intact. Uvula midline. Mild gingival swelling with tenderness over affected area, but no fluctuance. No swelling or tenderness of submental and submandibular regions.  No sublingual tenderness.  No tongue elevation.  Neck: Normal range of motion and full passive range of motion without pain. Neck supple.  Cardiovascular: Normal rate and regular rhythm.   Pulmonary/Chest: Effort normal and breath sounds normal. No respiratory distress. He has no wheezes.  Musculoskeletal: Normal range of motion.  Lymphadenopathy:       Head (right side): No submental, no submandibular, no tonsillar, no preauricular and no posterior auricular adenopathy present.  Head (left side): No submental, no submandibular, no tonsillar, no preauricular and no posterior auricular adenopathy present.    He has no cervical adenopathy.  Neurological: He is alert and oriented to person, place, and time.  Skin: Skin is warm and dry. No rash noted. He is not diaphoretic.    ED Course  Procedures (including critical care time)  Labs Reviewed - No data to display No results  found.   No diagnosis found.    MDM  Patient with toothache.  No gross abscess.  Exam unconcerning for Ludwig's angina or spread of infection.  Will treat with penicillin and pain medicine.  Urged patient to follow-up with dentist.          Pascal Lux [place], PA-C 06/06/12 [phone]

## 2012-06-06 NOTE — ED Notes (Signed)
Pt c/o dental pain x 2 years. Pt states his front tooh has slowly been falling out. Went to dentist this AM and had to reschedule appt. For next week

## 2012-06-07 NOTE — ED Provider Notes (Signed)
Medical screening examination/treatment/procedure(s) were performed by non-physician practitioner and as supervising physician I was immediately available for consultation/collaboration.   Dione Booze, MD 06/07/12 1414

## 2013-02-25 IMAGING — RF DG TIBIA/FIBULA 2V*L*
1 series · 6 of 6 positions shown · non-contrast
Comparison: Preoperative imaging [DATE] been

CLINICAL DATA: ORIF ankle fracture

LEFT TIBIA AND FIBULA - 2 VIEW

[Series 1: run · 6 of 6 slices shown]
[im 1/6]
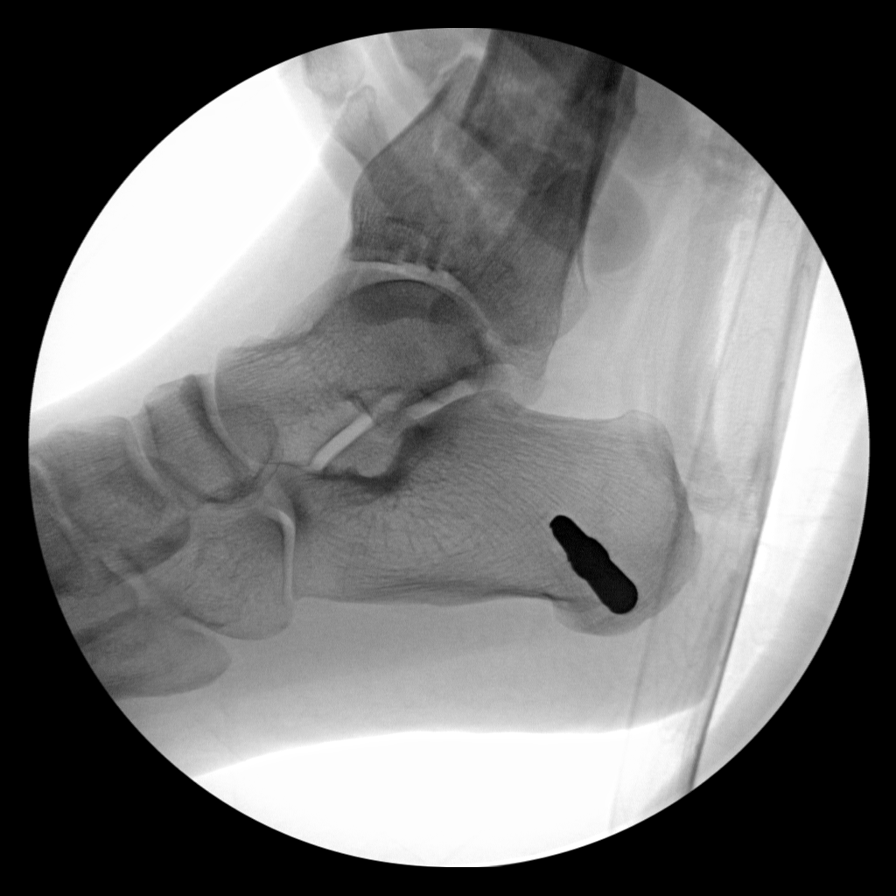
[im 2/6]
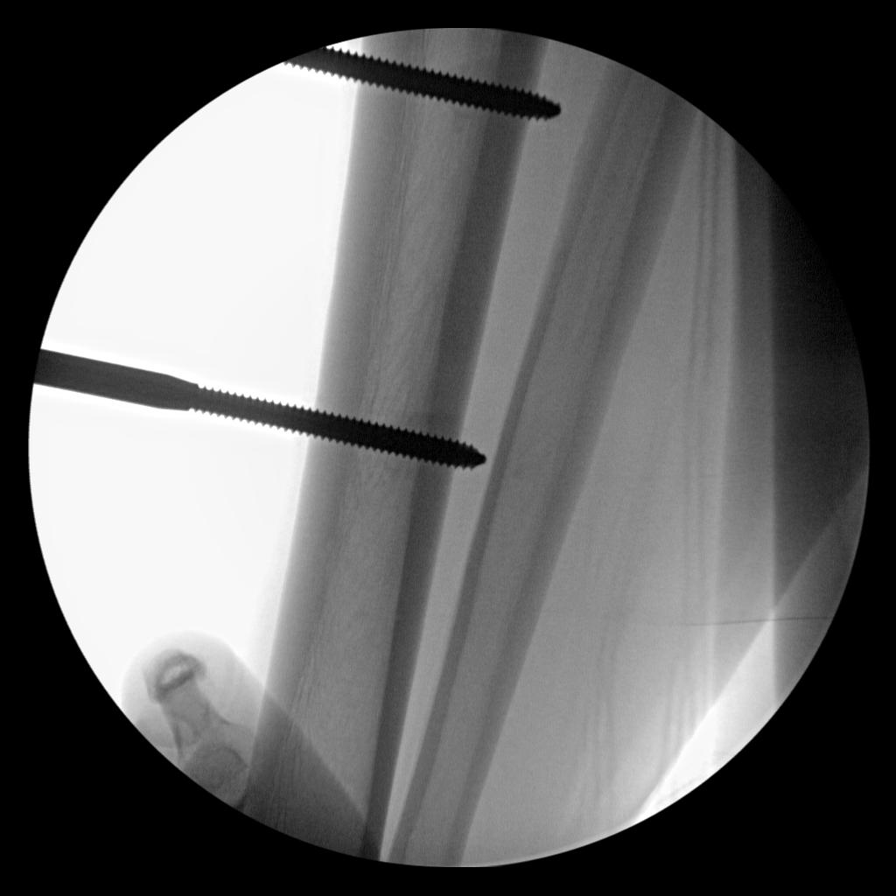
[im 3/6]
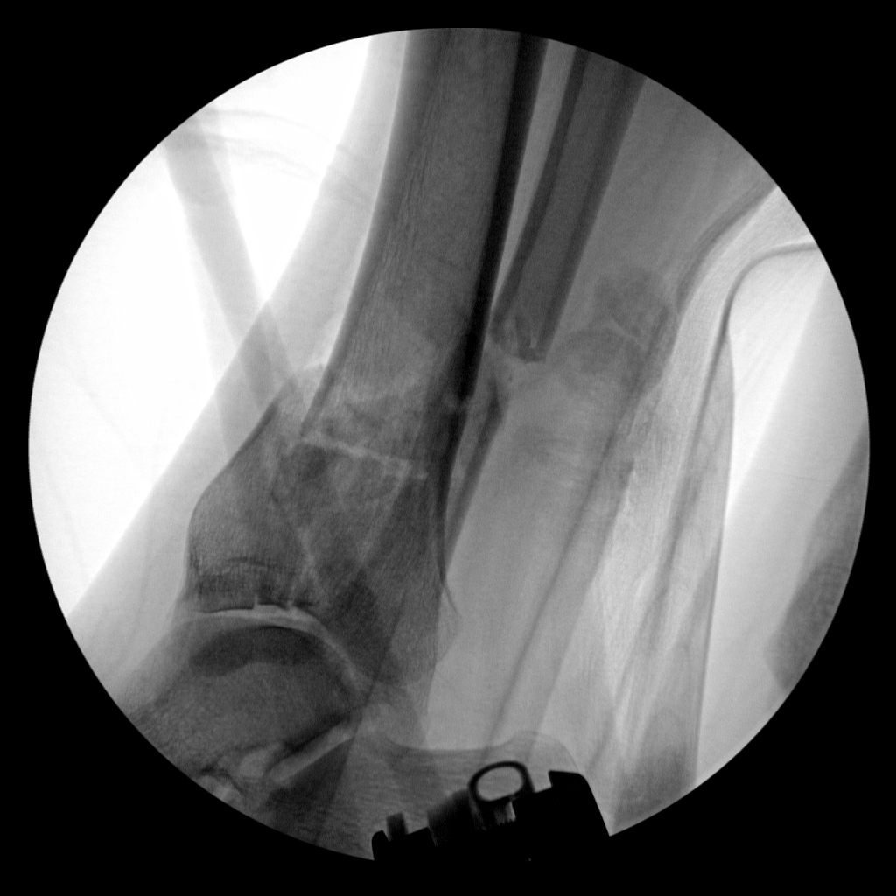
[im 4/6]
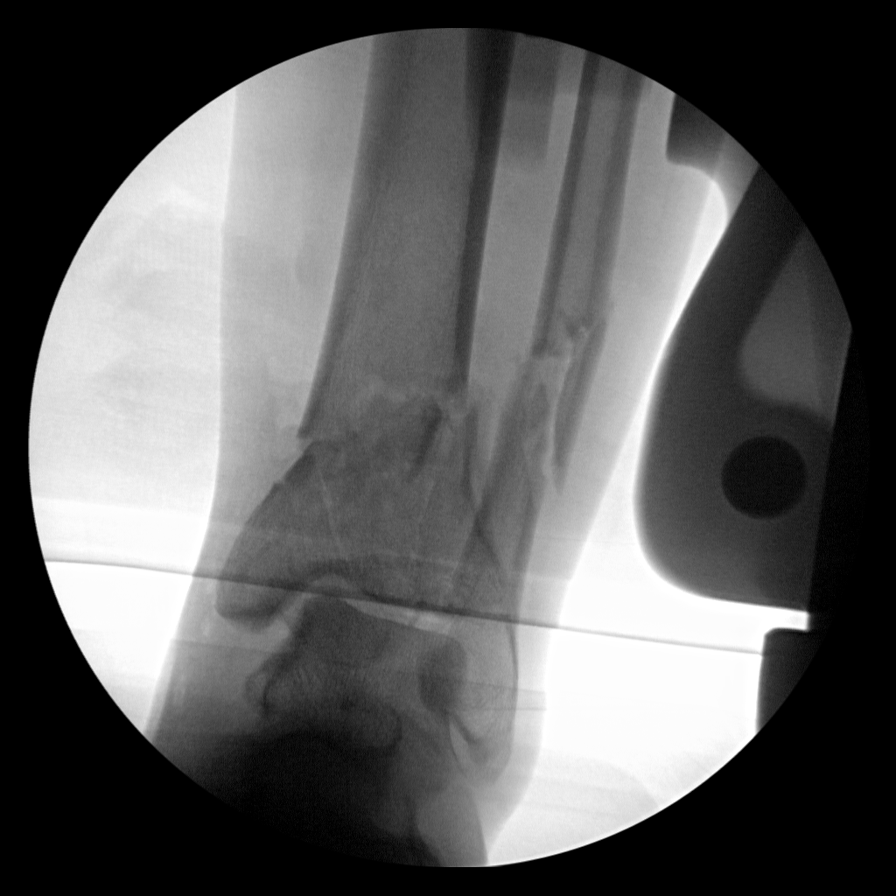
[im 5/6]
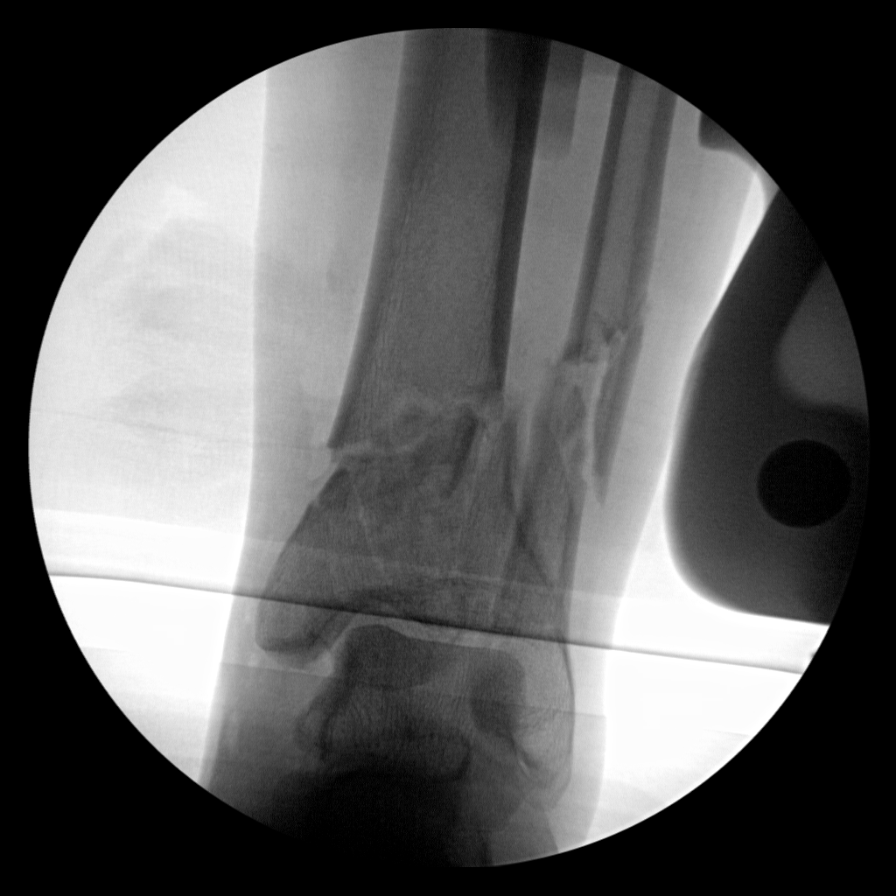
[im 6/6]
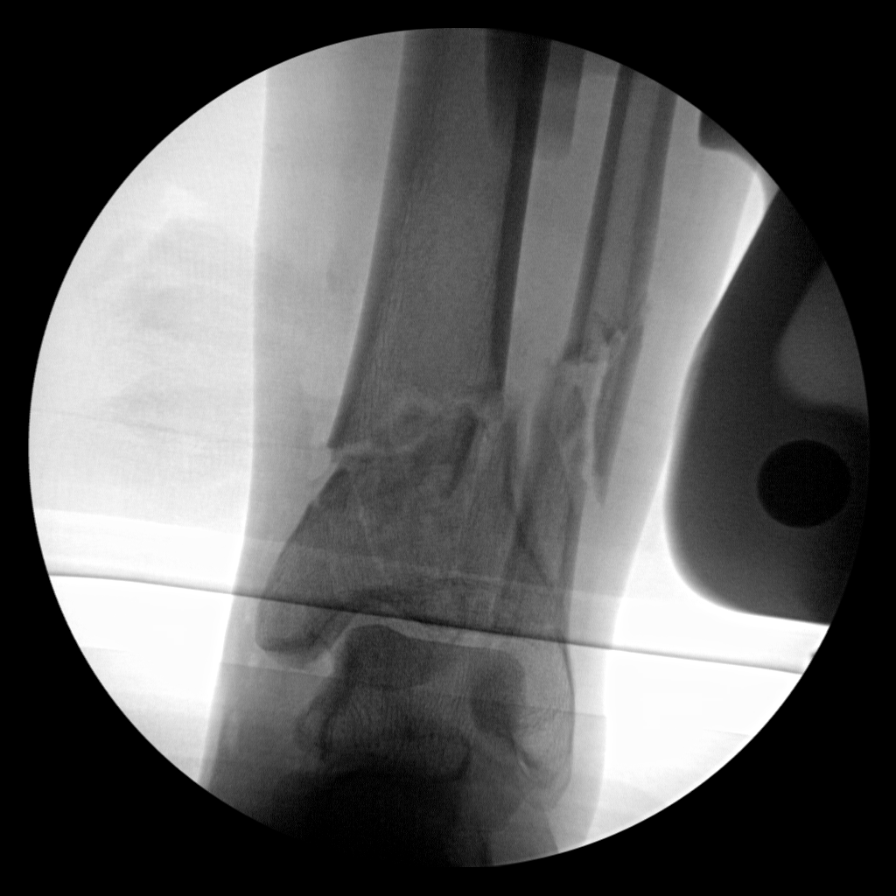

[6 of 6 positions shown; findings below may reference images not displayed]

FINDINGS: Six intraoperative images demonstrate incomplete
visualization of external fixation hardware presumably related to
that of ankle fractures.  Comminuted distal tibial and fibular
fractures again noted malalignment at the mortise.
IMPRESSION: Incomplete visualization of posterior fixation hardware fixing
bimalleolar fractures.

## 2014-06-26 ENCOUNTER — Emergency Department: Payer: PRIVATE HEALTH INSURANCE | Primary: Family

## 2014-06-26 ENCOUNTER — Inpatient Hospital Stay
Admit: 2014-06-26 | Discharge: 2014-06-26 | Disposition: A | Discharge status: Home / Self Care | Payer: PRIVATE HEALTH INSURANCE | Attending: Emergency Medicine

## 2014-06-26 ENCOUNTER — Emergency Department: Admit: 2014-06-26 | Payer: PRIVATE HEALTH INSURANCE | Primary: Family

## 2014-06-26 DIAGNOSIS — S93402A Sprain of unspecified ligament of left ankle, initial encounter: Secondary | ICD-10-CM

## 2014-06-26 MED ORDER — KETOROLAC TROMETHAMINE 10 MG TAB
10 mg | ORAL_TABLET | Freq: Three times a day (TID) | ORAL | Status: AC
Start: 2014-06-26 — End: 2014-07-01

## 2014-06-26 NOTE — ED Provider Notes (Signed)
HPI Comments: 9:05 AM    Brent Ross is a 34 y.o. male with a PSHx of left lower leg surgery and no pmhx who presents to the ED with c/o left lower leg pain x 3 days. Patient, who has a hx of surgery (06/07/11) with 2 rods and 14 bolts installed in his left lower leg, reports an onset of worsening pain to his left lower leg for the past 3 days. He states pain is causing him difficulty ambulating. He notes being unable to go to his orthopedic surgeon as he recently moved here from [facility]. He denies any associated injuries although fiance states patient bearing weight on his left leg while going down wooden steps 3 days ago and notes patient having pain since then. He also notes a callus developed on bottom of his left foot which his also causing him difficulty ambulation. No other symptoms or complaints were presented at this time.     Written by Glendora Score, ED Scribe, as dictated by Buelah Manis, MD.    Patient is a 34 y.o. male presenting with leg pain. The history is provided by the patient.   Leg Pain   This is a chronic problem. The current episode started more than 2 days ago (3 days). The pain is present in the left foot and left lower leg.        History reviewed. No pertinent past medical history.    Past Surgical History:   Procedure Laterality Date   ??? Hx orthopaedic       lt leg surgery for compound fracture, metal in legx 2         History reviewed. No pertinent family history.    History     Social History   ??? Marital Status: SINGLE     Spouse Name: N/A   ??? Number of Children: N/A   ??? Years of Education: N/A     Occupational History   ??? Not on file.     Social History Main Topics   ??? Smoking status: Current Every Day Smoker -- 0.50 packs/day   ??? Smokeless tobacco: Not on file   ??? Alcohol Use: Yes      Comment: socially    ??? Drug Use: No   ??? Sexual Activity: Not on file     Other Topics Concern   ??? Not on file     Social History Narrative   ??? No narrative on file        ALLERGIES: Review of patient's allergies indicates no known allergies.      Review of Systems   Musculoskeletal:        Left lower leg pain.    Skin:        Callus left foot (bottom).   All other systems reviewed and are negative.      Filed Vitals:    06/26/14 0845 06/26/14 0847   BP:  139/88   Pulse:  91   Temp:  98.3 ??F (36.8 ??C)   Resp:  16   Height:  (1.753 m)    Weight: 84.823 kg (187 lb)    SpO2:  99%            Physical Exam   Constitutional: He is oriented to person, place, and time. He appears well-developed and well-nourished.   HENT:   Head: Normocephalic and atraumatic.   Eyes: Pupils are equal, round, and reactive to light.   Neck: Neck supple.   Cardiovascular:  Normal rate, regular rhythm, S1 normal, S2 normal and normal heart sounds.    Pulmonary/Chest: Breath sounds normal. No respiratory distress. He has no wheezes. He has no rales. He exhibits no tenderness.   Abdominal: Soft. He exhibits no distension and no mass. There is no tenderness. There is no guarding.   Musculoskeletal: Normal range of motion. He exhibits no edema or tenderness.   Neurological: He is alert and oriented to person, place, and time. No cranial nerve deficit.   Skin: No rash noted.   Psychiatric: He has a normal mood and affect. His behavior is normal. Thought content normal.   Nursing note and vitals reviewed.       RESULTS:    XR ANKLE LT MIN 3 V   Preliminary Result   No fracture.    Written by Glendora ScorePriscilla P Nguyen, ED Scribe, as dictated by Buelah ManisJosphat S Marylouise Mallet, MD.        Labs Reviewed - No data to display    No results found for this or any previous visit (from the past 12 hour(s)).    MDM  Number of Diagnoses or Management Options  Diagnosis management comments:         Medications - No data to display    Procedures    PROGRESS NOTE:  9:05 AM  Initial assessment performed.  Written by Glendora ScorePriscilla P Nguyen, ED Scribe, as dictated by Buelah ManisJosphat S Marwah Disbro, MD.    DISCHARGE NOTELexine Baton:   Brent Ross's  results have been reviewed with him.  He has been counseled regarding his diagnosis, treatment, and plan.  He verbally conveys understanding and agreement of the signs, symptoms, diagnosis, treatment and prognosis and additionally agrees to follow up as discussed.  He also agrees with the care-plan and conveys that all of his questions have been answered.  I have also provided discharge instructions for him that include: educational information regarding their diagnosis and treatment, and list of reasons why they would want to return to the ED prior to their follow-up appointment, should his condition change.    The patient and/or family have been provided with education for proper Emergency Department utilization.    CLINICAL IMPRESSION:    1. Sprain of left ankle, unspecified ligament, initial encounter    2. Callus of foot    3. Other chronic pain        PLAN: DISCHARGE HOME    Follow-up Information     Follow up With Details Comments Contact Info    [facility]  [address]  [place] [id number]  [phone]    Thedore MinsMatthew A Hopson, DPM In 1 day  [address]  [facility]  [address]  [place] [id number]  [phone]      [facility]  As needed, If symptoms worsen [address]  [place] [id number]  [phone]          Current Discharge Medication List      START taking these medications    Details   ketorolac (TORADOL) 10 mg tablet Take 1 Tab by mouth every eight (8) hours for 5 days.  Qty: 15 Tab, Refills: 0             SCRIBE ATTESTATION:    This note was prepared by Kristopher OppenheimPriscilla Nguyen acting as Scribe for and in the presence of Karlene Southard S Amberia Bayless, MD.    Buelah ManisJosphat S Akiko Schexnider, MD: The scribe's documentation has been prepared under  my direction and personally reviewed by me in its entirety. I confirm that the note above accurately reflects all work, treatment, procedures, and medical decision making performed by me.     Written by Glendora Score, ED Scribe, as dictated by Buelah Manis, MD.

## 2014-06-26 NOTE — ED Notes (Signed)
Pt reports having surgery to left ankle 3 years ago, pt developed a callus to bottom of foot, at the ball of the foot.  Pt states he walks differently due to this callus.  Pt slipped down three steps yesterday and continues to have pain to left ankle, medially and laterally. No swelling or deformity noted. Strong pedal and posterior tibial pulses.

## 2014-06-26 NOTE — ED Notes (Signed)
Pt discharged to home, questions answered, denies further questions, verbalized understanding of instructions.  Armband removed and shredded. Pt educated regarding pain management and any pain medications prescribed, pt also educated on non pharmacologic pain management.

## 2014-06-26 NOTE — ED Notes (Signed)
Pt reports hx of surgery to lt leg 3 years ago, pt states had compound fracture, broke tib/fib, pt reports he is out of medicine, percocet, pt reports because he has been compensating for the pain he has been walking differently and now has pain in lt foot.

## 2015-05-26 ENCOUNTER — Emergency Department (HOSPITAL_COMMUNITY)
Admission: EM | Admit: 2015-05-26 | Discharge: 2015-05-26 | Disposition: A | Discharge status: Home / Self Care | Payer: Medicaid Other | Attending: Emergency Medicine | Admitting: Emergency Medicine

## 2015-05-26 ENCOUNTER — Encounter (HOSPITAL_COMMUNITY): Payer: Self-pay | Admitting: Emergency Medicine

## 2015-05-26 DIAGNOSIS — Z791 Long term (current) use of non-steroidal anti-inflammatories (NSAID): Secondary | ICD-10-CM | POA: Insufficient documentation

## 2015-05-26 DIAGNOSIS — H9202 Otalgia, left ear: Secondary | ICD-10-CM | POA: Insufficient documentation

## 2015-05-26 DIAGNOSIS — J029 Acute pharyngitis, unspecified: Secondary | ICD-10-CM | POA: Insufficient documentation

## 2015-05-26 DIAGNOSIS — F17210 Nicotine dependence, cigarettes, uncomplicated: Secondary | ICD-10-CM | POA: Insufficient documentation

## 2015-05-26 LAB — RAPID STREP SCREEN (MED CTR MEBANE ONLY): Streptococcus, Group A Screen (Direct): NEGATIVE

## 2015-05-26 MED ORDER — IBUPROFEN 400 MG PO TABS
800.0000 mg | ORAL_TABLET | Freq: Once | ORAL | Status: AC
  Administered 2015-05-26: 800 mg via ORAL
  Filled 2015-05-26: qty 2

## 2015-05-26 MED ORDER — HYDROCODONE-ACETAMINOPHEN 7.5-325 MG/15ML PO SOLN: 15.0000 mL | Freq: Three times a day (TID) | ORAL | Status: AC | PRN

## 2015-05-26 NOTE — ED Provider Notes (Signed)
CSN: [id number]     Arrival date & time 05/26/15  1633 History  By signing my name below, I, Iona BeardChristian Pulliam, attest that this documentation has been prepared under the direction and in the presence of Melburn HakeNicole Nadeau, [place]-C.   Electronically Signed: Iona Beardhristian Pulliam, ED Scribe 05/26/2015 at 6:08 PM.  Chief Complaint  Patient presents with  . Sore Throat    The history is provided by the patient. No language interpreter was used.   HPI Comments: Isaac Bryant is a 35 y.o. male who presents to the Emergency Department complaining of gradual onset, constant, sore throat, ongoing for four days. Pt reports associated pain with swallowing, swelling to his throat, and left ear pain. No other associated symptoms noted. Pt has used tylenol PTA with no relief to symptoms. No other worsening or alleviating factors noted. Pt denies fever, nasal congestion, rhinorrhea, chest pain, cough, difficulty breathing, facial swelling, neck swelling, or any other pertinent symptoms.   History reviewed. No pertinent past medical history. Past Surgical History  Procedure Laterality Date  . Hernia repair    . Orif ankle fracture  06/23/2011    Procedure: OPEN REDUCTION INTERNAL FIXATION (ORIF) ANKLE FRACTURE;  Surgeon: Toni ArthursJohn Hewitt, MD;  Location: [facility] OR;  Service: Orthopedics;  Laterality: Left;  removal of external fixator / orif pilon and fibula  . Fracture surgery     Family History  Problem Relation Age of Onset  . Diabetes Father    Social History  Substance Use Topics  . Smoking status: Current Every Day Smoker -- 0.50 packs/day    Types: Cigarettes  . Smokeless tobacco: Never Used  . Alcohol Use: Yes     Comment: weekends    Review of Systems  Constitutional: Negative for fever.  HENT: Positive for ear pain and sore throat. Negative for congestion, facial swelling and rhinorrhea.   Respiratory: Negative for cough and shortness of breath.   Cardiovascular: Negative for chest pain.     Allergies  Review of patient's allergies indicates no known allergies.  Home Medications   Prior to Admission medications   Medication Sig Start Date End Date Taking? Authorizing Provider  HYDROcodone-acetaminophen (HYCET) 7.5-325 mg/15 ml solution Take 15 mLs by mouth every 8 (eight) hours as needed for moderate pain. 05/26/15   Barrett HenleNicole Elizabeth Nadeau, PA-C  ibuprofen (ADVIL,MOTRIN) 200 MG tablet Take 400 mg by mouth every 6 (six) hours as needed for pain.    Historical Provider, MD  naproxen sodium (ALEVE) 220 MG tablet Take 220 mg by mouth 2 (two) times daily with a meal.    Historical Provider, MD   BP 127/76 mmHg  Pulse 107  Temp(Src) 98.7 F (37.1 C) (Oral)  Resp 16  SpO2 98% Physical Exam  Constitutional: He is oriented to person, place, and time. He appears well-developed and well-nourished.  HENT:  Head: Normocephalic and atraumatic.  Right Ear: Tympanic membrane normal.  Left Ear: Tympanic membrane normal.  Nose: Nose normal. Right sinus exhibits no maxillary sinus tenderness and no frontal sinus tenderness. Left sinus exhibits no maxillary sinus tenderness and no frontal sinus tenderness.  Mouth/Throat: Uvula is midline and mucous membranes are normal. No trismus in the jaw. Normal dentition. No dental abscesses, uvula swelling or dental caries. Posterior oropharyngeal erythema present. No oropharyngeal exudate, posterior oropharyngeal edema or tonsillar abscesses.  No facial or neck swelling. No muffled voice   Eyes: Conjunctivae and EOM are normal. Pupils are equal, round, and reactive to light. Right eye exhibits no discharge.  Left eye exhibits no discharge. No scleral icterus.  Neck: Normal range of motion. Neck supple.  Pulmonary/Chest: Effort normal. No stridor.  Lymphadenopathy:    He has cervical adenopathy (bilateral submandibular ).  Neurological: He is alert and oriented to person, place, and time.  Skin: Skin is warm and dry.  Nursing note and vitals  reviewed.   ED Course  Procedures (including critical care time) DIAGNOSTIC STUDIES: Oxygen Saturation is 98% on RA, normal by my interpretation.    COORDINATION OF CARE: 5:18 PM-Discussed treatment plan which includes ibuprofen and rapid strep screen with pt at bedside and pt agreed to plan.   Labs Review Labs Reviewed  RAPID STREP SCREEN (NOT AT [facility])  CULTURE, GROUP A STREP [facility])    Imaging Review No results found. I have personally reviewed and evaluated these lab results as part of my medical decision-making.   EKG Interpretation None      MDM   Final diagnoses:  Sore throat    Pt afebrile without tonsillar exudate, negative strep. Presents with mild cervical lymphadenopathy, & dysphagia; diagnosis of viral pharyngitis. No abx indicated. DC w symptomatic tx for pain  Pt does not appear dehydrated, but did discuss importance of water rehydration. Presentation non concerning for PTA or infxn spread to soft tissue. No trismus or uvula deviation. Specific return precautions discussed. Pt able to drink water in ED without difficulty with intact air way. Recommended PCP follow up.   I personally performed the services described in this documentation, which was scribed in my presence. The recorded information has been reviewed and is accurate.    Satira Sark [place], [place] 05/26/15 1810  Pricilla Loveless, MD 05/28/15 [phone]

## 2015-05-26 NOTE — Discharge Instructions (Signed)
Take your medication as prescribed as needed for your sore throat. I also recommend continuing to take 600 mg ibuprofen 4 times daily as needed for pain relief. Continue drinking at least six 8 ounce glasses of water daily to remain hydrated at home. Please follow up with a primary care provider from the Resource Guide provided below in 4-5 days if your symptoms have not improved. Please return to the Emergency Department if symptoms worsen or new onset of difficulty opening your jaw, fever, drooling due to being unable to swallow, facial/neck swelling, difficulty breathing, wheezing.

## 2015-05-26 NOTE — ED Notes (Signed)
Pt states he has had a sore throat for a few days. Painful to swallow. No other symptoms

## 2015-05-29 LAB — CULTURE, GROUP A STREP (THRC)

## 2020-08-10 ENCOUNTER — Ambulatory Visit: Attending: Family | Primary: Family

## 2020-08-10 ENCOUNTER — Ambulatory Visit: Admit: 2020-08-10 | Discharge: 2020-08-10 | Payer: BLUE CROSS/BLUE SHIELD | Attending: Family

## 2020-08-10 DIAGNOSIS — Z7689 Persons encountering health services in other specified circumstances: Secondary | ICD-10-CM

## 2020-08-10 MED ORDER — MELOXICAM 7.5 MG TAB
7.5 mg | ORAL_TABLET | Freq: Every day | ORAL | 0 refills | Status: AC
Start: 2020-08-10 — End: 2020-10-09

## 2020-08-10 NOTE — Progress Notes (Signed)
Brent Ross (DOB: 16-Apr-1980) is a 40 y.o. male is here for evaluation of the following chief complaint(s): Establish Care (no dr in a long time)  Pt presents to establish care and chronic back pain and pain to hands. Pt works with [employer]. Pt denies any other complaints or concerns.       Subjective/Objective:   Brent Ross was seen today for Establish Care (no dr in a long time)      Review of Systems   Constitutional: Negative.    HENT: Negative.    Eyes: Negative.    Respiratory: Negative.    Cardiovascular: Negative.    Gastrointestinal: Negative.    Genitourinary: Negative.    Musculoskeletal: Positive for back pain.   Skin: Negative.    Neurological: Negative.    Endo/Heme/Allergies: Negative.    Psychiatric/Behavioral: Negative.         Physical Exam  Vitals reviewed.   Constitutional:       General: He is not in acute distress.     Appearance: He is normal weight. He is not ill-appearing.   HENT:      Head: Normocephalic and atraumatic.      Right Ear: Tympanic membrane, ear canal and external ear normal.      Left Ear: Tympanic membrane, ear canal and external ear normal.      Ears:      Comments: Denies difficulty hearing.      Nose: Nose normal.      Mouth/Throat:      Lips: Pink.      Mouth: Mucous membranes are moist.      Pharynx: Oropharynx is clear.      Comments: Denies any acute vision changes. Has not been to dentist in 5-6 years.    Can find one.   Eyes:      General: Lids are normal.      Extraocular Movements: Extraocular movements intact.      Conjunctiva/sclera: Conjunctivae normal.      Comments: Denies any acute vision changes. Pt has not been to the eye doctor in "years". Can find one.    Neck:      Thyroid: No thyroid mass, thyromegaly or thyroid tenderness.      Vascular: No carotid bruit or JVD.      Trachea: Trachea and phonation normal.   Cardiovascular:      Rate and Rhythm: Normal rate and regular rhythm.      Pulses: Normal pulses.           Radial pulses are 2+ on the right  side and 2+ on the left side.      Heart sounds: Normal heart sounds, S1 normal and S2 normal.   Pulmonary:      Effort: Pulmonary effort is normal.      Breath sounds: Normal breath sounds and air entry.   Abdominal:      General: Abdomen is flat. Bowel sounds are normal.      Palpations: Abdomen is soft.      Tenderness: There is no abdominal tenderness. There is no right CVA tenderness or left CVA tenderness.      Hernia: No hernia is present.   Musculoskeletal:         General: Normal range of motion.      Cervical back: Full passive range of motion without pain and normal range of motion.      Right lower leg: No edema.      Left lower leg: No edema.  Comments: On exam , has no pain to back.    Lymphadenopathy:      Cervical: No cervical adenopathy.   Skin:     General: Skin is warm and dry.      Capillary Refill: Capillary refill takes less than 2 seconds.   Neurological:      General: No focal deficit present.      Mental Status: He is alert and oriented to person, place, and time.   Psychiatric:         Mood and Affect: Mood normal.         Behavior: Behavior normal.         Thought Content: Thought content normal.         Judgment: Judgment normal.          BP 118/78 (BP 1 Location: Left arm, BP Patient Position: At rest, BP Cuff Size: Large adult)    Pulse 72    Temp 97.8 ??F (36.6 ??C) (Oral)    Resp 16    Ht 5\' 9"  (1.753 m)    Wt 187 lb (84.8 kg)    SpO2 96%    BMI 27.62 kg/m??      No visits with results within 6 Month(s) from this visit.   Latest known visit with results is:   No results found for any previous visit.         Past Surgical History:   Procedure Laterality Date   ??? HX ORTHOPAEDIC      lt leg surgery for compound fracture, metal in legx 2        History reviewed. No pertinent past medical history.     Current Outpatient Medications   Medication Instructions   ??? meloxicam (MOBIC) 7.5 mg, Oral, DAILY        Assessment/Plan:     The above diagnosis is a chronic problem.  We discussed  expected course, resolution, and complications of diagnosis in detail.  I advised him to call back or return to office if symptoms worsen/change/persist.        ICD-10-CM ICD-9-CM    1. Encounter to establish care with new doctor  Z76.89 V65.8 LIPID PANEL      METABOLIC PANEL, COMPREHENSIVE      TSH 3RD GENERATION      CBC WITH AUTOMATED DIFF      HEPATITIS C AB, RFLX TO QT BY PCR      URINALYSIS W/ RFLX MICROSCOPIC      REFERRAL TO PAIN MANAGEMENT   2. Stiffness of hand joint, unspecified laterality  M25.649 719.54    3. Chronic back pain, unspecified back location, unspecified back pain laterality  M54.9 724.5     G89.29 338.29       Return in about 2 months (around 10/11/2020).    An electronic signature was used to authenticate this note.  -- 12/11/2020, NP

## 2020-08-10 NOTE — Progress Notes (Signed)
 Chief Complaint   Patient presents with   . Establish Care     no dr in a long time     3 most recent PHQ Screens 08/10/2020   Little interest or pleasure in doing things Not at all   Feeling down, depressed, irritable, or hopeless Not at all   Total Score PHQ 2 0       Abuse Screening Questionnaire 08/10/2020   Do you ever feel afraid of your partner? N   Are you in a relationship with someone who physically or mentally threatens you? N   Is it safe for you to go home? Y       ADL Assessment 08/10/2020   Feeding yourself No Help Needed   Getting from bed to chair No Help Needed   Getting dressed No Help Needed   Bathing or showering No Help Needed   Walk across the room (includes cane/walker) No Help Needed   Using the telphone No Help Needed   Taking your medications No Help Needed   Preparing meals No Help Needed   Managing money (expenses/bills) No Help Needed   Moderately strenuous housework (laundry) No Help Needed   Shopping for personal items (toiletries/medicines) No Help Needed   Shopping for groceries No Help Needed   Driving No Help Needed   Climbing a flight of stairs No Help Needed   Getting to places beyond walking distances No Help Needed

## 2020-08-10 NOTE — Addendum Note (Signed)
Addendum Note by Haskell Flirt, LPN at 44/01/02 0830                Author: Haskell Flirt, LPN  Service: --  Author Type: Licensed Nurse       Filed: 11/13/20 1414  Encounter Date: 08/10/2020  Status: Signed          Editor: Haskell Flirt, LPN (Licensed Nurse)          Addended by: Haskell Flirt on: 11/13/2020 02:14 PM    Modules accepted: Orders

## 2020-08-12 NOTE — Telephone Encounter (Signed)
Referral for pain management faxed to [phone] with last office visit notes - they will contact patient with an appt - confirmation of receipt received  Haskell Flirt, LPN  2/35/3614  1:27 PM

## 2020-08-12 NOTE — Telephone Encounter (Signed)
-----   Message from Lujean Rave sent at 08/10/2020  1:00 PM EDT -----  Subject: Message to Provider    QUESTIONS  Information for Provider? Patient was seen today and needs to know if his   referral was sent to pain management. Please call.   ---------------------------------------------------------------------------  --------------  Cleotis Lema INFO  [phone]; OK to leave message on voicemail  ---------------------------------------------------------------------------  --------------  SCRIPT ANSWERS  Relationship to Patient? Self

## 2020-08-19 NOTE — Telephone Encounter (Signed)
Faxed referral order and last office notes to Pain Management at [facility] at [phone] - confirmation of receipt received  [facility] LPN

## 2020-08-19 NOTE — Telephone Encounter (Signed)
-----   Message from Ellouise Newer sent at 08/18/2020 10:32 AM EDT -----  Subject: Message to Provider    QUESTIONS  Information for Provider? Patient called to say that he has not heard   anything from the pain management office. Patient asked for the referral   to be sent on the 11th. Please call back and advise.   ---------------------------------------------------------------------------  --------------  Brent Ross INFO  [phone]; OK to leave message on voicemail  ---------------------------------------------------------------------------  --------------  SCRIPT ANSWERS  Relationship to Patient? Self

## 2020-10-12 ENCOUNTER — Ambulatory Visit: Attending: Family | Primary: Family

## 2020-11-05 NOTE — Telephone Encounter (Signed)
-----   Message from Arlana Pouch sent at 11/02/2020 11:37 AM EDT -----  Subject: Referral Request    Reason for referral request? Patient called and stated that he is needing   a new updated referral for his pain management doctor. Dr. Jomarie Longs.   [phone]. Please let the patient know when this is completed so that   he can reschedule. [facility].   Provider patient wants to be referred to(if known):     Provider Phone Number(if known):    Additional Information for Provider?   ---------------------------------------------------------------------------  --------------  Brent Ross INFO    [phone]; OK to leave message on voicemail  ---------------------------------------------------------------------------  --------------

## 2020-11-13 NOTE — Telephone Encounter (Signed)
Notified patient that I have faxed a referral order to Dr Jose Persia at [facility] in [place], [place] at [phone] - confirmation of receipt received  Haskell Flirt, LPN  02/30/1720  2:23 PM

## 2020-11-27 NOTE — Telephone Encounter (Signed)
-----   Message from Gregory sent at 11/19/2020 12:04 PM EDT -----  Subject: Referral Request    Reason for referral request? Pt needs referral to pain management. Pt   stated the office has been faxing to the wrong fax number. Please send to   [phone].   Provider patient wants to be referred to(if known):     Provider Phone Number(if known):    Additional Information for Provider?   ---------------------------------------------------------------------------  --------------  Cleotis Lema INFO    [phone]; OK to leave message on voicemail  ---------------------------------------------------------------------------  --------------

## 2021-01-07 ENCOUNTER — Encounter: Payer: BLUE CROSS/BLUE SHIELD | Attending: Family | Primary: Family

## 2021-11-23 ENCOUNTER — Encounter: Payer: MEDICAID | Attending: Family | Primary: Family

## 2022-01-12 ENCOUNTER — Encounter: Attending: Family | Primary: Family

## 2022-01-27 ENCOUNTER — Encounter: Payer: MEDICAID | Attending: Family Medicine | Primary: Family

## 2022-01-27 NOTE — Progress Notes (Deleted)
Wellness Exam:    No chief complaint on file.    he is a 41 y.o. year old male who presents for evaluation for their Wellness Visit.    Has the following concern: ***  Depression Screen is completed and assessed=yes  Medication list reviewed and adjusted for accuracy=yes  Immunizations reviewed and updated=yes  Health/Preventative Screenings reviewed and updated=yes    Problem List Items Addressed This Visit    None          Reviewed PmHx, RxHx, FmHx, SocHx, AllgHx and updated and dated in the chart.      Objective:       Assessment/ Plan:       -Depression Screen,   -Medication list updated and reviewed for any changes   -A comprehensive review of medical issues and a plan was formulated  -The following vaccines were recommended ***     -Health Screenings for preventions were addressed and a plan was formulated    -Discussed with patient cancer risk factors and appropriate screenings for age  -Patient evaluated for colonoscopy and referred if needed per screeing criteria  -Discussed with patient diet and exercise and formulated a plan as needed  -Alcohol screening addressed          I have discussed the diagnosis with the patient and the intended plan as seen in the above orders. The patient understands and agrees with the plan. The patient has received an after-visit summary and questions were answered concerning future plans.     Medication Side Effects and Warnings were discussed with patien  Patient Labs were reviewed and or requested  Patient Past Records were reviewed and or requested    See patient instructions, went over them personally with the patient. Emphasized compliance. Questions answered.Patient states that they understand the plan of action and will call if there are any issues or misunderstandings.        Bari Mantis MD [facility]
